# Patient Record
Sex: Male | Born: 1950 | Hispanic: Refuse to answer | Smoking: Never smoker
Health system: Southern US, Community
[De-identification: ages and names within clinical notes are randomized; demographics above are authoritative.]

## PROBLEM LIST (undated history)

## (undated) DIAGNOSIS — C61 Malignant neoplasm of prostate: Secondary | ICD-10-CM

## (undated) DIAGNOSIS — I10 Essential (primary) hypertension: Secondary | ICD-10-CM

## (undated) HISTORY — PX: NO PAST SURGERIES: SHX2092

## (undated) HISTORY — PX: PROSTATE BIOPSY: SHX241

---

## 2008-11-02 ENCOUNTER — Ambulatory Visit (HOSPITAL_COMMUNITY): Admission: RE | Admit: 2008-11-02 | Discharge: 2008-11-02 | Payer: Self-pay | Admitting: General Surgery

## 2010-10-15 NOTE — H&P (Signed)
[name], Ponce           ACCOUNT NO.:  [id number]   MEDICAL RECORD NO.:  [id number]         PATIENT TYPE:  AMB   LOCATION:  DAY                           FACILITY:  [facility]   PHYSICIAN:  Dalia Heading, M.D.  DATE OF BIRTH:  May 21, 1951   DATE OF ADMISSION:  DATE OF DISCHARGE:  LH                              HISTORY & PHYSICAL   CHIEF COMPLAINT:  Need for screening colonoscopy.   HISTORY OF PRESENT ILLNESS:  The patient is a 60 year old black male,  who is referred for endoscopic evaluation.  He needs colonoscopy for  screening purposes.  No abdominal pain, weight loss, nausea, vomiting,  diarrhea, constipation, melena, hematochezia have been noted.  He has  never had a colonoscopy.  There is no family history of colon carcinoma.   PAST MEDICAL HISTORY:  Hypertension.   PAST SURGICAL HISTORY:  Unremarkable.   CURRENT MEDICATIONS:  Atenolol 100 mg p.o. daily   ALLERGIES:  No known drug allergies.   REVIEW OF SYSTEMS:  Noncontributory.   PHYSICAL EXAMINATION:  GENERAL:  The patient is a well-developed, well-  nourished black male, in no acute distress.  LUNGS:  Clear to auscultation with equal breath sounds bilaterally.  HEART:  Regular rate and rhythm without S3, S4, murmurs.  ABDOMEN:  Soft, nontender, nondistended.  No hepatosplenomegaly or  masses noted.  RECTAL:  Deferred due to procedure.   IMPRESSION:  Need for screening colonoscopy.   PLAN:  The patient is scheduled for a colonoscopy on November 02, 2008.  The  risks and benefits of the procedure including bleeding and perforation  were fully explained to the patient, gave informed consent.      Dalia Heading, M.D.  Electronically Signed     MAJ/MEDQ  D:  10/24/2008  T:  10/25/2008  Job:  [id number]   cc:   Juan Ponce, M.D.  Fax: [phone]

## 2010-10-15 NOTE — H&P (Signed)
NAMESYRE, KNERR           ACCOUNT NO.:  [id number]   MEDICAL RECORD NO.:  [id number]         PATIENT TYPE:  AMB   LOCATION:  DAY                           FACILITY:  [facility]   PHYSICIAN:  Dalia Heading, M.D.  DATE OF BIRTH:  May 24, 1951   DATE OF ADMISSION:  DATE OF DISCHARGE:  LH                              HISTORY & PHYSICAL   CHIEF COMPLAINT:  Need for screening colonoscopy.   HISTORY OF PRESENT ILLNESS:  The patient is a 60 year old black male,  who is referred for endoscopic evaluation.  He needs colonoscopy for  screening purposes.  No abdominal pain, weight loss, nausea, vomiting,  diarrhea, constipation, melena, hematochezia have been noted.  He has  never had a colonoscopy.  There is no family history of colon carcinoma.   PAST MEDICAL HISTORY:  Hypertension.   PAST SURGICAL HISTORY:  Unremarkable.   CURRENT MEDICATIONS:  Atenolol 100 mg p.o. daily   ALLERGIES:  No known drug allergies.   REVIEW OF SYSTEMS:  Noncontributory.   PHYSICAL EXAMINATION:  GENERAL:  The patient is a well-developed, well-  nourished black male, in no acute distress.  LUNGS:  Clear to auscultation with equal breath sounds bilaterally.  HEART:  Regular rate and rhythm without S3, S4, murmurs.  ABDOMEN:  Soft, nontender, nondistended.  No hepatosplenomegaly or  masses noted.  RECTAL:  Deferred due to procedure.   IMPRESSION:  Need for screening colonoscopy.   PLAN:  The patient is scheduled for a colonoscopy on November 02, 2008.  The  risks and benefits of the procedure including bleeding and perforation  were fully explained to the patient, gave informed consent.      Dalia Heading, M.D.     MAJ/MEDQ  D:  10/24/2008  T:  10/25/2008  Job:  [id number]   cc:   Kirk Ruths, M.D.  Fax: [phone]

## 2015-07-10 MED FILL — OLMSRTN-AMLDPN-HCTZ 40-10-2: 40-10-25 | 90 days supply | Qty: 90 | Fill #1

## 2015-08-27 DIAGNOSIS — Z1389 Encounter for screening for other disorder: Secondary | ICD-10-CM | POA: Diagnosis not present

## 2015-08-27 DIAGNOSIS — E6609 Other obesity due to excess calories: Secondary | ICD-10-CM | POA: Diagnosis not present

## 2015-08-27 DIAGNOSIS — B49 Unspecified mycosis: Secondary | ICD-10-CM | POA: Diagnosis not present

## 2015-08-27 DIAGNOSIS — Z6830 Body mass index (BMI) 30.0-30.9, adult: Secondary | ICD-10-CM | POA: Diagnosis not present

## 2015-08-27 MED FILL — FLUCONAZOLE 150 MG TABLET: 150 | 3 days supply | Qty: 3 | Fill #0

## 2015-08-27 MED FILL — CLOTRIMAZOLE-BETAMETHASONE: 1-0.05 | 15 days supply | Qty: 30 | Fill #0

## 2015-10-15 MED FILL — OLMSRTN-AMLDPN-HCTZ 40-10-2: 40-10-25 | 90 days supply | Qty: 90 | Fill #2

## 2016-01-07 MED FILL — OLMSRTN-AMLDPN-HCTZ 40-10-2: 40-10-25 | 90 days supply | Qty: 90 | Fill #3

## 2016-04-02 MED FILL — HYDROCODON-APAP 7.5-325: 7.5-325 | 4 days supply | Qty: 12 | Fill #0

## 2016-04-02 MED FILL — IBUPROFEN 800 MG TABLET: 800 | 4 days supply | Qty: 12 | Fill #0

## 2016-04-10 MED FILL — HYDROCODON-APAP 7.5-325: 7.5-325 | 3 days supply | Qty: 12 | Fill #0

## 2016-04-10 MED FILL — IBUPROFEN 800 MG TABLET: 800 | 3 days supply | Qty: 12 | Fill #0

## 2016-04-16 DIAGNOSIS — R7309 Other abnormal glucose: Secondary | ICD-10-CM | POA: Diagnosis not present

## 2016-04-16 DIAGNOSIS — E6609 Other obesity due to excess calories: Secondary | ICD-10-CM | POA: Diagnosis not present

## 2016-04-16 DIAGNOSIS — I10 Essential (primary) hypertension: Secondary | ICD-10-CM | POA: Diagnosis not present

## 2016-04-16 DIAGNOSIS — Z6830 Body mass index (BMI) 30.0-30.9, adult: Secondary | ICD-10-CM | POA: Diagnosis not present

## 2016-04-16 DIAGNOSIS — Z0000 Encounter for general adult medical examination without abnormal findings: Secondary | ICD-10-CM | POA: Diagnosis not present

## 2016-04-16 DIAGNOSIS — E782 Mixed hyperlipidemia: Secondary | ICD-10-CM | POA: Diagnosis not present

## 2016-04-16 DIAGNOSIS — Z1389 Encounter for screening for other disorder: Secondary | ICD-10-CM | POA: Diagnosis not present

## 2016-05-02 MED FILL — OLMSRTN-AMLDPN-HCTZ 40-10-1: 40-10-12.5 | 90 days supply | Qty: 90 | Fill #0

## 2016-07-24 MED FILL — OLMSRTN-AMLDPN-HCTZ 40-10-1: 40-10-12.5 | 90 days supply | Qty: 90 | Fill #1

## 2016-10-22 MED FILL — OLMSRTN-AMLDPN-HCTZ 40-10-1: 40-10-12.5 | 90 days supply | Qty: 90 | Fill #2

## 2017-01-16 MED FILL — OLMSRTN-AMLDPN-HCTZ 40-10-1: 40-10-12.5 | 30 days supply | Qty: 30 | Fill #0

## 2017-03-02 DIAGNOSIS — Z6831 Body mass index (BMI) 31.0-31.9, adult: Secondary | ICD-10-CM | POA: Diagnosis not present

## 2017-03-02 DIAGNOSIS — I10 Essential (primary) hypertension: Secondary | ICD-10-CM | POA: Diagnosis not present

## 2017-03-02 DIAGNOSIS — Z0000 Encounter for general adult medical examination without abnormal findings: Secondary | ICD-10-CM | POA: Diagnosis not present

## 2017-03-02 DIAGNOSIS — E6609 Other obesity due to excess calories: Secondary | ICD-10-CM | POA: Diagnosis not present

## 2017-03-02 MED FILL — OLMSRTN-AMLDPN-HCTZ 40-10-1: 40-10-12.5 | 90 days supply | Qty: 90 | Fill #0

## 2017-03-04 DIAGNOSIS — Z0000 Encounter for general adult medical examination without abnormal findings: Secondary | ICD-10-CM | POA: Diagnosis not present

## 2017-03-04 DIAGNOSIS — R739 Hyperglycemia, unspecified: Secondary | ICD-10-CM | POA: Diagnosis not present

## 2017-03-17 DIAGNOSIS — K7689 Other specified diseases of liver: Secondary | ICD-10-CM | POA: Diagnosis not present

## 2017-03-30 DIAGNOSIS — K760 Fatty (change of) liver, not elsewhere classified: Secondary | ICD-10-CM | POA: Diagnosis not present

## 2017-03-30 DIAGNOSIS — R748 Abnormal levels of other serum enzymes: Secondary | ICD-10-CM | POA: Diagnosis not present

## 2017-05-28 MED FILL — OLMSRTN-AMLDPN-HCTZ 40-10-1: 40-10-12.5 | 90 days supply | Qty: 90 | Fill #1

## 2017-08-19 MED FILL — OLMSRTN-AMLDPN-HCTZ 40-10-1: 40-10-12.5 | 90 days supply | Qty: 90 | Fill #0

## 2017-11-03 MED FILL — AMOXICILLIN 500 MG CAPSULE: 500 | 10 days supply | Qty: 30 | Fill #0

## 2017-11-04 DIAGNOSIS — I10 Essential (primary) hypertension: Secondary | ICD-10-CM | POA: Diagnosis not present

## 2017-11-04 DIAGNOSIS — R945 Abnormal results of liver function studies: Secondary | ICD-10-CM | POA: Diagnosis not present

## 2017-11-04 DIAGNOSIS — R972 Elevated prostate specific antigen [PSA]: Secondary | ICD-10-CM | POA: Diagnosis not present

## 2017-11-04 DIAGNOSIS — E782 Mixed hyperlipidemia: Secondary | ICD-10-CM | POA: Diagnosis not present

## 2017-11-04 DIAGNOSIS — Z1389 Encounter for screening for other disorder: Secondary | ICD-10-CM | POA: Diagnosis not present

## 2017-11-04 DIAGNOSIS — Z6829 Body mass index (BMI) 29.0-29.9, adult: Secondary | ICD-10-CM | POA: Diagnosis not present

## 2017-11-04 DIAGNOSIS — R7309 Other abnormal glucose: Secondary | ICD-10-CM | POA: Diagnosis not present

## 2017-11-04 DIAGNOSIS — E663 Overweight: Secondary | ICD-10-CM | POA: Diagnosis not present

## 2017-11-04 MED FILL — TELMISARTAN-AMLODIPINE 40-1: 40-10 | 90 days supply | Qty: 90 | Fill #0

## 2018-01-26 MED FILL — TELMISARTAN-AMLODIPINE 40-1: 40-10 | 90 days supply | Qty: 90 | Fill #1

## 2018-03-25 DIAGNOSIS — E782 Mixed hyperlipidemia: Secondary | ICD-10-CM | POA: Diagnosis not present

## 2018-03-25 DIAGNOSIS — Z23 Encounter for immunization: Secondary | ICD-10-CM | POA: Diagnosis not present

## 2018-03-25 DIAGNOSIS — Z1389 Encounter for screening for other disorder: Secondary | ICD-10-CM | POA: Diagnosis not present

## 2018-03-25 DIAGNOSIS — R945 Abnormal results of liver function studies: Secondary | ICD-10-CM | POA: Diagnosis not present

## 2018-03-25 DIAGNOSIS — Z6832 Body mass index (BMI) 32.0-32.9, adult: Secondary | ICD-10-CM | POA: Diagnosis not present

## 2018-03-25 DIAGNOSIS — R030 Elevated blood-pressure reading, without diagnosis of hypertension: Secondary | ICD-10-CM | POA: Diagnosis not present

## 2018-03-25 DIAGNOSIS — I10 Essential (primary) hypertension: Secondary | ICD-10-CM | POA: Diagnosis not present

## 2018-03-25 DIAGNOSIS — Z0001 Encounter for general adult medical examination with abnormal findings: Secondary | ICD-10-CM | POA: Diagnosis not present

## 2018-04-23 MED FILL — TELMISARTAN-AMLODIPINE 40-1: 40-10 | 90 days supply | Qty: 90 | Fill #2

## 2018-05-05 ENCOUNTER — Ambulatory Visit: Payer: 59 | Admitting: Urology

## 2018-05-05 DIAGNOSIS — R972 Elevated prostate specific antigen [PSA]: Secondary | ICD-10-CM | POA: Diagnosis not present

## 2018-05-05 MED FILL — levoFLOXacin 750 MG TABS: 750 | 1 days supply | Qty: 1 | Fill #0

## 2018-05-06 ENCOUNTER — Other Ambulatory Visit: Payer: Self-pay | Admitting: Urology

## 2018-05-06 DIAGNOSIS — R972 Elevated prostate specific antigen [PSA]: Secondary | ICD-10-CM

## 2018-05-19 ENCOUNTER — Ambulatory Visit (HOSPITAL_COMMUNITY)
Admission: RE | Admit: 2018-05-19 | Discharge: 2018-05-19 | Disposition: A | Discharge status: Home / Self Care | Payer: 59 | Source: Ambulatory Visit | Attending: Urology | Admitting: Urology

## 2018-05-19 ENCOUNTER — Encounter (HOSPITAL_COMMUNITY): Payer: Self-pay

## 2018-05-19 DIAGNOSIS — R972 Elevated prostate specific antigen [PSA]: Secondary | ICD-10-CM | POA: Diagnosis not present

## 2018-05-19 DIAGNOSIS — C61 Malignant neoplasm of prostate: Secondary | ICD-10-CM | POA: Insufficient documentation

## 2018-05-19 HISTORY — DX: Essential (primary) hypertension: I10

## 2018-05-19 MED ORDER — GENTAMICIN SULFATE 40 MG/ML IJ SOLN
80.0000 mg | Freq: Once | INTRAMUSCULAR | Status: AC
  Administered 2018-05-19: 80 mg via INTRAMUSCULAR

## 2018-05-19 MED ORDER — LIDOCAINE HCL (PF) 2 % IJ SOLN
10.0000 mL | Freq: Once | INTRAMUSCULAR | Status: AC
  Administered 2018-05-19: 10 mL

## 2018-05-19 MED ORDER — GENTAMICIN SULFATE 40 MG/ML IJ SOLN
INTRAMUSCULAR | Status: AC
  Administered 2018-05-19: 80 mg via INTRAMUSCULAR
  Filled 2018-05-19: qty 2

## 2018-05-19 MED ORDER — LIDOCAINE HCL (PF) 2 % IJ SOLN
INTRAMUSCULAR | Status: AC
  Administered 2018-05-19: 10 mL
  Filled 2018-05-19: qty 10

## 2018-06-23 ENCOUNTER — Institutional Professional Consult (permissible substitution): Payer: 59 | Admitting: Urology

## 2018-06-23 DIAGNOSIS — C61 Malignant neoplasm of prostate: Secondary | ICD-10-CM | POA: Diagnosis not present

## 2018-07-01 NOTE — Progress Notes (Signed)
GU Location of Tumor / Histology: Prostatic Adenocarcinoma  If Prostate Cancer, Gleason Score is (4+ 3) and PSA is (5.2). Prostate volume: 63.05 grams.  Milana Kidney was referred by Collene Mares, PA-C to Dr. Nicolette Bang for further evaluation of an elevated PSA.  Biopsies of prostate from 05/19/2018   Past/Anticipated interventions by urology, if any: prostate biopsy (05/19/2018),  Referral for consideration of IMRT vs brachytherapy  Past/Anticipated interventions by medical oncology, if any: no  Weight changes, if any: no  Bowel/Bladder complaints, if any: Reports nocturia X 2. Reports mild ED and LUTS. SHIM 19. IPSS 8. Denies dysuria, hematuria, urinary leakage or incontinence.   Nausea/Vomiting, if any: no  Pain issues, if any:  Denies any new pain.  SAFETY ISSUES:  Prior radiation? no  Pacemaker/ICD? no  Possible current pregnancy? no, male patient  Is the patient on methotrexate? no  Current Complaints / other details: 68 year old male. NKDA. Brother was dx with prostate ca in his early 53s and received xrt. Reports another brother had lung ca (smoker). Reports a sister with breast ca. Works at [employer] in the [place] and has for 34 years.

## 2018-07-02 DIAGNOSIS — C61 Malignant neoplasm of prostate: Secondary | ICD-10-CM | POA: Insufficient documentation

## 2018-07-05 ENCOUNTER — Encounter: Payer: Self-pay | Admitting: Radiation Oncology

## 2018-07-06 ENCOUNTER — Other Ambulatory Visit: Payer: Self-pay

## 2018-07-06 ENCOUNTER — Ambulatory Visit
Admission: RE | Admit: 2018-07-06 | Discharge: 2018-07-06 | Disposition: A | Discharge status: Home / Self Care | Payer: 59 | Source: Ambulatory Visit | Attending: Radiation Oncology | Admitting: Radiation Oncology

## 2018-07-06 ENCOUNTER — Encounter: Payer: Self-pay | Admitting: Radiation Oncology

## 2018-07-06 ENCOUNTER — Encounter: Payer: Self-pay | Admitting: Medical Oncology

## 2018-07-06 ENCOUNTER — Telehealth: Payer: Self-pay | Admitting: Radiation Oncology

## 2018-07-06 DIAGNOSIS — C61 Malignant neoplasm of prostate: Secondary | ICD-10-CM | POA: Insufficient documentation

## 2018-07-06 DIAGNOSIS — I10 Essential (primary) hypertension: Secondary | ICD-10-CM | POA: Insufficient documentation

## 2018-07-06 DIAGNOSIS — Z803 Family history of malignant neoplasm of breast: Secondary | ICD-10-CM | POA: Insufficient documentation

## 2018-07-06 DIAGNOSIS — Z801 Family history of malignant neoplasm of trachea, bronchus and lung: Secondary | ICD-10-CM | POA: Diagnosis not present

## 2018-07-06 DIAGNOSIS — Z8042 Family history of malignant neoplasm of prostate: Secondary | ICD-10-CM | POA: Diagnosis not present

## 2018-07-06 DIAGNOSIS — R972 Elevated prostate specific antigen [PSA]: Secondary | ICD-10-CM | POA: Diagnosis not present

## 2018-07-06 HISTORY — DX: Malignant neoplasm of prostate: C61

## 2018-07-06 NOTE — Telephone Encounter (Signed)
Phoned patient out of concern. Patient reports he is running late.

## 2018-07-06 NOTE — Progress Notes (Signed)
Introduced myself to Mr. Velis as the prostate nurse navigator and my role. He is not interested in surgery but needs more information about his radiation options. He asked how long he can postpone treatment. I explained he will need to discuss with Dr. Tammi Klippel. He works in the [place] at [employer] and is thinking about retirement and would begin treatment after his retirement. I explained the side effects of treatment should not hinder him form working and we will be happy to work with him and his work schedule. I gave him my business card and asked him to call me with questions or concerns. He voiced understanding.

## 2018-07-06 NOTE — Progress Notes (Signed)
Radiation Oncology         [phone] ________________________________  Initial Outpatient Consultation  Name: Juan Ponce MRN: [id number]  Date: 07/06/2018  DOB: Mar 07, 1951  LY:[id number], Jenny Reichmann, MD  McKenzie, Candee Furbish, MD   REFERRING PHYSICIAN: Cleon Gustin, MD  DIAGNOSIS: 68 y.o. gentleman with Stage T1c adenocarcinoma of the prostate with Gleason score of 4+3, and PSA of 5.2.    ICD-10-CM   1. Malignant neoplasm of prostate ([place]) C61     HISTORY OF PRESENT ILLNESS: Juan Ponce is a 68 y.o. male with a diagnosis of prostate cancer. He was noted to have an elevated PSA of 5.2 in June 2019 by his primary care physician, Dr. Sharilyn Sites.  Accordingly, he was referred for evaluation in urology by Dr. Alyson Ingles on 05/05/2018, and a digital rectal examination was performed at that time revealing no prostate nodules.  The patient proceeded to transrectal ultrasound with 12 biopsies of the prostate on 05/19/2018.  The prostate volume measured 63.05 cc.  Out of 12 core biopsies, 1 was positive.  The maximum Gleason score was 4+3, and this was seen in the left base lateral.  The patient reviewed the biopsy results with his urologist and he has kindly been referred today for discussion of potential radiation treatment options.   PREVIOUS RADIATION THERAPY: No  PAST MEDICAL HISTORY:  Past Medical History:  Diagnosis Date  . Hypertension   . Prostate cancer ([place])       PAST SURGICAL HISTORY: Past Surgical History:  Procedure Laterality Date  . NO PAST SURGERIES    . PROSTATE BIOPSY      FAMILY HISTORY:  Family History  Problem Relation Age of Onset  . Prostate cancer Brother   . Breast cancer Sister   . Lung cancer Brother   . Colon cancer Neg Hx   . Pancreatic cancer Neg Hx     SOCIAL HISTORY:  Social History   Socioeconomic History  . Marital status: Legally Separated    Spouse name: Not on file  . Number of children: Not on file  . Years of  education: Not on file  . Highest education level: Not on file  Occupational History  . Not on file  Social Needs  . Financial resource strain: Not on file  . Food insecurity:    Worry: Not on file    Inability: Not on file  . Transportation needs:    Medical: Not on file    Non-medical: Not on file  Tobacco Use  . Smoking status: Never Smoker  . Smokeless tobacco: Never Used  Substance and Sexual Activity  . Alcohol use: Yes    Comment: 2 drinks per day  . Drug use: Never  . Sexual activity: Yes    Comment: mild ED  Lifestyle  . Physical activity:    Days per week: Not on file    Minutes per session: Not on file  . Stress: Not on file  Relationships  . Social connections:    Talks on phone: Not on file    Gets together: Not on file    Attends religious service: Not on file    Active member of club or organization: Not on file    Attends meetings of clubs or organizations: Not on file    Relationship status: Not on file  . Intimate partner violence:    Fear of current or ex partner: Not on file    Emotionally abused: Not on file  Physically abused: Not on file    Forced sexual activity: Not on file  Other Topics Concern  . Not on file  Social History Narrative  . Not on file    ALLERGIES: Patient has no known allergies.  MEDICATIONS:  Current Outpatient Medications  Medication Sig Dispense Refill  . Telmisartan-amLODIPine 40-10 MG TABS   2   No current facility-administered medications for this encounter.     REVIEW OF SYSTEMS:  On review of systems, the patient reports that he is doing well overall. He denies any chest pain, shortness of breath, cough, fevers, chills, night sweats, or unintended weight changes. He denies any bowel disturbances, and denies abdominal pain, nausea or vomiting. He denies any new musculoskeletal or joint aches or pains. His IPSS was 8, indicating moderate urinary symptoms with nocturia x2. He denies dysuria, hematuria, leakage or  incontinence. His SHIM was 19, indicating he does have mild erectile dysfunction. A complete review of systems is obtained and is otherwise negative.    PHYSICAL EXAM:  Wt Readings from Last 3 Encounters:  07/06/18 191 lb 6.4 oz (86.8 kg)   Temp Readings from Last 3 Encounters:  07/06/18 98.2 F (36.8 C) (Oral)  05/19/18 98.3 F (36.8 C) (Oral)   BP Readings from Last 3 Encounters:  07/06/18 (!) 149/94  05/19/18 (!) 164/98   Pulse Readings from Last 3 Encounters:  07/06/18 (!) 106  05/19/18 100   Pain Assessment Pain Score: 0-No pain/10  In general this is a well appearing African-American male in no acute distress. He is alert and oriented x4 and appropriate throughout the examination. HEENT reveals that the patient is normocephalic, atraumatic. EOMs are intact. PERRLA. Skin is intact without any evidence of gross lesions. Cardiovascular exam reveals a regular rate and rhythm, no clicks rubs or murmurs are auscultated. Chest is clear to auscultation bilaterally. Lymphatic assessment is performed and does not reveal any adenopathy in the cervical, supraclavicular, axillary, or inguinal chains. Abdomen has active bowel sounds in all quadrants and is intact. The abdomen is soft, non tender, non distended. Lower extremities are negative for pretibial pitting edema, deep calf tenderness, cyanosis or clubbing.   KPS = 100  100 - Normal; no complaints; no evidence of disease. 90   - Able to carry on normal activity; minor signs or symptoms of disease. 80   - Normal activity with effort; some signs or symptoms of disease. 49   - Cares for self; unable to carry on normal activity or to do active work. 60   - Requires occasional assistance, but is able to care for most of his personal needs. 50   - Requires considerable assistance and frequent medical care. 60   - Disabled; requires special care and assistance. 38   - Severely disabled; hospital admission is indicated although death not  imminent. 71   - Very sick; hospital admission necessary; active supportive treatment necessary. 10   - Moribund; fatal processes progressing rapidly. 0     - Dead  Karnofsky DA, Abelmann WH, Craver LS and Burchenal JH [phone]) The use of the nitrogen mustards in the palliative treatment of carcinoma: with particular reference to bronchogenic carcinoma Cancer 1 634-56  LABORATORY DATA:  No results found for: WBC, HGB, HCT, MCV, PLT No results found for: NA, K, CL, CO2 No results found for: ALT, AST, GGT, ALKPHOS, BILITOT   RADIOGRAPHY: No results found.    IMPRESSION/PLAN: 1. 68 y.o. gentleman with Stage T1c adenocarcinoma of the prostate with  Gleason Score of 4+3, and PSA of 5.2. We discussed the patient's workup and outlined the nature of prostate cancer in this setting. The patient's T stage, Gleason's score, and PSA put him into the unfavorable intermediate risk group. Accordingly, he is eligible for a variety of potential treatment options including brachytherapy or 5.5 weeks of external radiation.  We discussed that should he elect to proceed with prostate brachytherapy, given his large prostate volume of 63 g, we would recommend he start a 5 alpha reductase inhibitor to downsize the prostate prior to the procedure.  We discussed the available radiation techniques, and focused on the details and logistics and delivery. We discussed and outlined the risks, benefits, short and long-term effects associated with radiotherapy and compared and contrasted these with prostatectomy. We discussed the role of SpaceOAR in reducing the rectal toxicity associated with radiotherapy.   At the end of the conversation the patient remains undecided regarding his final treatment preference.  He appears to be leaning towards a 5-1/2-week course of daily prostate IMRT but is still considering brachii therapy as well. We will contact him in the near future to touch base on which treatment option he prefers. He knows  to call with any additional questions or concerns.  We spent 60 minutes face to face with the patient and more than 50% of that time was spent in counseling and/or coordination of care.    Nicholos Johns, PA-C    Tyler Pita, MD  [facility] Direct Dial: [phone]  Fax: [phone] [email or web address]  Skype  [email or web address]  This document serves as a record of services personally performed by Tyler Pita, MD and Freeman Caldron, PA-C. It was created on their behalf by Rae Lips, a trained medical scribe. The creation of this record is based on the scribe's personal observations and the providers' statements to them. This document has been checked and approved by the attending providers.

## 2018-07-06 NOTE — Progress Notes (Signed)
See progress note under physician encounter. 

## 2018-07-15 ENCOUNTER — Telehealth: Payer: Self-pay | Admitting: Radiation Oncology

## 2018-07-15 NOTE — Telephone Encounter (Signed)
Received message from Romie Jumper that the patient has questions. Phoned patient to inquire. Patient questions if he gets the seed how long he will have them in. Explained the radioactive prostate seeds would remain in his prostate forever but only be therapeutic for up to one year. Patient questions his success rate with treatment. Explained that based on his gleason score, psa, and prostate volume he has a 90% or better chance of cure with either brachytherapy or IMRT. Patient questioned his stage of cancer. Explained he is a T1c. Patient questions if he will have to take medication with the radiation seeds. Explained that because his prostate size is large he will need to take medication in the form of a shot or pill given to him by Dr. Alyson Ingles to reduce the size of his prostate before the seeds can be placed. Patient reports he is scheduled to follow up with Dr. Alyson Ingles on 08/04/2018. Patient verbalized understanding of all reviewed.

## 2018-07-19 ENCOUNTER — Encounter: Payer: Self-pay | Admitting: Medical Oncology

## 2018-08-02 MED FILL — TELMISARTAN-AMLODIPINE 40-1: 40-10 | 90 days supply | Qty: 90 | Fill #0

## 2018-08-04 ENCOUNTER — Ambulatory Visit: Payer: 59 | Admitting: Urology

## 2018-08-04 DIAGNOSIS — C61 Malignant neoplasm of prostate: Secondary | ICD-10-CM

## 2018-08-23 ENCOUNTER — Telehealth: Payer: Self-pay | Admitting: Medical Oncology

## 2018-09-07 ENCOUNTER — Telehealth: Payer: Self-pay | Admitting: Medical Oncology

## 2018-09-07 NOTE — Telephone Encounter (Signed)
Juan Ponce returned call stating that he did not receive any prescriptions at his visit 3/4 with Dr. Alyson Ingles. Ashlyn-PA notified.

## 2018-09-07 NOTE — Telephone Encounter (Signed)
Left message with patient to inform him Dr. Noland Fordyce office should be calling him with an appointment to discuss ADT. I explained this should help to downsize his prostate and cover him while we are waiting to get brachytherapy scheduled. I asked him to call with questions or concerns.

## 2018-09-07 NOTE — Telephone Encounter (Signed)
Spoke with  Dr. Noland Fordyce nurse to discuss patient receiving ADT per Ashlyn to downsize his prostate and cover him while waiting to get brachytherapy scheduled. If this is not an option, patient could move forward with IMRT. She informed me Dr. Alyson Ingles is out of the office until Thursday but she will discuss with his upon his return. I asked her to call me with his decision.

## 2018-09-07 NOTE — Progress Notes (Signed)
Left message with patient encouraging him to further discuss treatment options with Dr. Alyson Ingles at his follow up March 3. I asked him to call me and I can help answer questions and or concerns.

## 2018-09-07 NOTE — Telephone Encounter (Signed)
Left message with patient asking if Dr. Alyson Ingles gave him any medications to help reduce the size of his prostate on 3/4 office visit. I asked him to return my call.

## 2018-09-09 ENCOUNTER — Telehealth: Payer: Self-pay | Admitting: Medical Oncology

## 2018-09-09 NOTE — Telephone Encounter (Signed)
Juan Ponce called back asking why he needs ADT. I explained that it will help to reduce the size of his prostate in preparation for seed implant and keep the cancer from growing while waiting to be scheduled for brachytherapy. We discussed the side effects of ADT and he voiced understanding. He should be receiving a call from Dr. Noland Fordyce office to schedule.

## 2018-09-17 ENCOUNTER — Telehealth: Payer: Self-pay | Admitting: Medical Oncology

## 2018-09-17 NOTE — Telephone Encounter (Signed)
Spoke with Juan Ponce at [facility] to inform her that Mr. Piper needs ST-ADT to decrease prostate size and to cover him due to delay in seed implant surgeries being delayed due to COVID-19. I spoke with Dr. Noland Fordyce nurse last week but not sure if he has been able to address. She states she will send a high priority message to him.

## 2018-09-21 ENCOUNTER — Telehealth: Payer: Self-pay | Admitting: Medical Oncology

## 2018-09-21 NOTE — Telephone Encounter (Signed)
Spoke with [facility] regarding ST-ADT for patient to help downsize prostate and to cover him while waiting to schedule seed implant. I informed her that I have called and left message for Dr. Alyson Ingles but it has not been scheduled. She states that due to the COVID-19 that the days in the office have been limited but she will try to get his scheduled tomorrow.

## 2018-09-23 ENCOUNTER — Telehealth: Payer: Self-pay | Admitting: Medical Oncology

## 2018-09-23 NOTE — Telephone Encounter (Signed)
Spoke with Juan Ponce to see if he spoke with Dr. Noland Fordyce office to schedule appointment for ADT. He states he received a voicemail yesterday and tired to return the call but was unable to get reach them. He states he is off tomorrow and asked if someone could call him. I will relay the message to Dr. Noland Fordyce nurse in the am. They are not in the [facility] office today.

## 2018-09-27 ENCOUNTER — Other Ambulatory Visit: Payer: Self-pay | Admitting: Urology

## 2018-09-28 ENCOUNTER — Telehealth: Payer: Self-pay | Admitting: *Deleted

## 2018-09-28 ENCOUNTER — Telehealth: Payer: Self-pay | Admitting: Urology

## 2018-09-28 NOTE — Telephone Encounter (Signed)
Has an appointment 5/6 with Dr. Alyson Ingles to discuss ADT to downsize prostate and protect from disease progression while awaiting brachytherapy implant.

## 2018-09-28 NOTE — Telephone Encounter (Signed)
CALLED PATIENT TO INFORM OF PRE-SEED PLANNING CT AND FU FOR 11-25-18 @ [facility] AND HIS IMPLANT, LVM FOR A RETURN CALL

## 2018-10-06 ENCOUNTER — Ambulatory Visit (INDEPENDENT_AMBULATORY_CARE_PROVIDER_SITE_OTHER): Payer: 59 | Admitting: Urology

## 2018-10-06 DIAGNOSIS — C61 Malignant neoplasm of prostate: Secondary | ICD-10-CM | POA: Diagnosis not present

## 2018-10-12 ENCOUNTER — Encounter: Payer: Self-pay | Admitting: Urology

## 2018-10-12 ENCOUNTER — Encounter: Payer: Self-pay | Admitting: Medical Oncology

## 2018-10-12 ENCOUNTER — Other Ambulatory Visit: Payer: Self-pay | Admitting: Urology

## 2018-10-12 ENCOUNTER — Ambulatory Visit (INDEPENDENT_AMBULATORY_CARE_PROVIDER_SITE_OTHER): Payer: 59 | Admitting: Urology

## 2018-10-12 DIAGNOSIS — C61 Malignant neoplasm of prostate: Secondary | ICD-10-CM

## 2018-10-12 NOTE — Progress Notes (Signed)
Patient started ADT with [facility] on 10/06/2018 and is scheduled for brachiytherapy on 12/23/2018.  Nicholos Johns, MMS, PA-C [facility]: [phone]  Fax: [phone]

## 2018-10-13 ENCOUNTER — Other Ambulatory Visit: Payer: Self-pay | Admitting: Urology

## 2018-11-03 DIAGNOSIS — E6609 Other obesity due to excess calories: Secondary | ICD-10-CM | POA: Diagnosis not present

## 2018-11-03 DIAGNOSIS — C61 Malignant neoplasm of prostate: Secondary | ICD-10-CM | POA: Diagnosis not present

## 2018-11-03 DIAGNOSIS — Z1389 Encounter for screening for other disorder: Secondary | ICD-10-CM | POA: Diagnosis not present

## 2018-11-03 DIAGNOSIS — Z6832 Body mass index (BMI) 32.0-32.9, adult: Secondary | ICD-10-CM | POA: Diagnosis not present

## 2018-11-03 DIAGNOSIS — I10 Essential (primary) hypertension: Secondary | ICD-10-CM | POA: Diagnosis not present

## 2018-11-03 MED FILL — OLMESARTAN MEDOXOMIL 40 MG: 40 | 30 days supply | Qty: 30 | Fill #0

## 2018-11-03 MED FILL — DILT-XR 240 MG CAP SA: 240 | 90 days supply | Qty: 90 | Fill #0

## 2018-11-10 ENCOUNTER — Ambulatory Visit (INDEPENDENT_AMBULATORY_CARE_PROVIDER_SITE_OTHER): Payer: 59 | Admitting: Urology

## 2018-11-10 DIAGNOSIS — C61 Malignant neoplasm of prostate: Secondary | ICD-10-CM

## 2018-11-23 ENCOUNTER — Telehealth: Payer: Self-pay | Admitting: *Deleted

## 2018-11-23 NOTE — Telephone Encounter (Signed)
CALLED PATIENT TO INFORM OF CHANGE TO SCHEDULE ON 11-25-18, LVM FOR A RETURN CALL

## 2018-11-25 ENCOUNTER — Other Ambulatory Visit: Payer: Self-pay

## 2018-11-25 ENCOUNTER — Ambulatory Visit
Admission: RE | Admit: 2018-11-25 | Discharge: 2018-11-25 | Disposition: A | Discharge status: Home / Self Care | Payer: 59 | Source: Ambulatory Visit | Attending: Urology | Admitting: Urology

## 2018-11-25 ENCOUNTER — Encounter (HOSPITAL_COMMUNITY): Payer: 59

## 2018-11-25 ENCOUNTER — Encounter (HOSPITAL_COMMUNITY)
Admission: RE | Admit: 2018-11-25 | Discharge: 2018-11-25 | Disposition: A | Discharge status: Home / Self Care | Payer: 59 | Source: Ambulatory Visit | Attending: Urology | Admitting: Urology

## 2018-11-25 ENCOUNTER — Ambulatory Visit
Admission: RE | Admit: 2018-11-25 | Discharge: 2018-11-25 | Disposition: A | Discharge status: Home / Self Care | Payer: 59 | Source: Ambulatory Visit | Attending: Radiation Oncology | Admitting: Radiation Oncology

## 2018-11-25 ENCOUNTER — Ambulatory Visit (HOSPITAL_COMMUNITY)
Admission: RE | Admit: 2018-11-25 | Discharge: 2018-11-25 | Disposition: A | Discharge status: Home / Self Care | Payer: 59 | Source: Ambulatory Visit | Attending: Anesthesiology | Admitting: Anesthesiology

## 2018-11-25 DIAGNOSIS — C61 Malignant neoplasm of prostate: Secondary | ICD-10-CM | POA: Insufficient documentation

## 2018-11-25 DIAGNOSIS — Z801 Family history of malignant neoplasm of trachea, bronchus and lung: Secondary | ICD-10-CM | POA: Diagnosis not present

## 2018-11-25 DIAGNOSIS — I10 Essential (primary) hypertension: Secondary | ICD-10-CM | POA: Insufficient documentation

## 2018-11-25 DIAGNOSIS — Z01818 Encounter for other preprocedural examination: Secondary | ICD-10-CM | POA: Diagnosis not present

## 2018-11-25 DIAGNOSIS — Z803 Family history of malignant neoplasm of breast: Secondary | ICD-10-CM | POA: Diagnosis not present

## 2018-11-25 NOTE — Progress Notes (Signed)
  Radiation Oncology         [phone] ________________________________  Name: Juan Ponce MRN: [id number]  Date: 11/25/2018  DOB: 06/09/1950  SIMULATION AND TREATMENT PLANNING NOTE PUBIC ARCH STUDY  XM:DYJWLKH, Jenny Reichmann, MD  Cleon Gustin, MD  DIAGNOSIS: 68 y.o. gentleman with Stage T1c adenocarcinoma of the prostate with Gleason score of 4+3, and PSA of 5.2.     ICD-10-CM   1. Malignant neoplasm of prostate ([place])  C61     COMPLEX SIMULATION:  The patient presented today for evaluation for possible prostate seed implant. He was brought to the radiation planning suite and placed supine on the CT couch. A 3-dimensional image study set was obtained in upload to the planning computer. There, on each axial slice, I contoured the prostate gland. Then, using three-dimensional radiation planning tools I reconstructed the prostate in view of the structures from the transperineal needle pathway to assess for possible pubic arch interference. In doing so, I did not appreciate any pubic arch interference. Also, the patient's prostate volume was estimated based on the drawn structure. The volume was 61 cc.  Given the pubic arch appearance and prostate volume, patient remains a good candidate to proceed with prostate seed implant. Today, he freely provided informed written consent to proceed.    PLAN: The patient will undergo prostate seed implant.   ________________________________  Sheral Apley. Tammi Klippel, M.D.

## 2018-12-06 MED FILL — OLMESARTAN MEDOXOMIL 40 MG: 40 | 30 days supply | Qty: 30 | Fill #1

## 2018-12-15 ENCOUNTER — Ambulatory Visit (INDEPENDENT_AMBULATORY_CARE_PROVIDER_SITE_OTHER): Payer: 59 | Admitting: Urology

## 2018-12-15 DIAGNOSIS — C61 Malignant neoplasm of prostate: Secondary | ICD-10-CM

## 2018-12-20 ENCOUNTER — Other Ambulatory Visit: Payer: Self-pay

## 2018-12-20 ENCOUNTER — Other Ambulatory Visit (HOSPITAL_COMMUNITY)
Admission: RE | Admit: 2018-12-20 | Discharge: 2018-12-20 | Disposition: A | Discharge status: Home / Self Care | Payer: 59 | Source: Ambulatory Visit | Attending: Urology | Admitting: Urology

## 2018-12-20 ENCOUNTER — Encounter (HOSPITAL_COMMUNITY)
Admission: RE | Admit: 2018-12-20 | Discharge: 2018-12-20 | Disposition: A | Discharge status: Home / Self Care | Payer: 59 | Source: Ambulatory Visit | Attending: Urology | Admitting: Urology

## 2018-12-20 DIAGNOSIS — I10 Essential (primary) hypertension: Secondary | ICD-10-CM | POA: Diagnosis not present

## 2018-12-20 DIAGNOSIS — Z1159 Encounter for screening for other viral diseases: Secondary | ICD-10-CM | POA: Diagnosis not present

## 2018-12-20 DIAGNOSIS — C61 Malignant neoplasm of prostate: Secondary | ICD-10-CM | POA: Diagnosis not present

## 2018-12-20 LAB — COMPREHENSIVE METABOLIC PANEL
ALT: 47 U/L — ABNORMAL HIGH (ref 0–44)
AST: 29 U/L (ref 15–41)
Albumin: 4.1 g/dL (ref 3.5–5.0)
Alkaline Phosphatase: 68 U/L (ref 38–126)
Anion gap: 5 (ref 5–15)
BUN: 12 mg/dL (ref 8–23)
CO2: 28 mmol/L (ref 22–32)
Calcium: 9.1 mg/dL (ref 8.9–10.3)
Chloride: 107 mmol/L (ref 98–111)
Creatinine, Ser: 0.87 mg/dL (ref 0.61–1.24)
GFR calc Af Amer: >60 mL/min (ref >60)
GFR calc non Af Amer: >60 mL/min (ref >60)
Glucose, Bld: 180 mg/dL — ABNORMAL HIGH (ref 70–99)
Potassium: 3.9 mmol/L (ref 3.5–5.1)
Sodium: 140 mmol/L (ref 135–145)
Total Bilirubin: 0.4 mg/dL (ref 0.3–1.2)
Total Protein: 7.1 g/dL (ref 6.5–8.1)

## 2018-12-20 LAB — CBC
HCT: 41.2 % (ref 39.0–52.0)
Hemoglobin: 13.7 g/dL (ref 13.0–17.0)
MCH: 32.4 pg (ref 26.0–34.0)
MCHC: 33.3 g/dL (ref 30.0–36.0)
MCV: 97.4 fL (ref 80.0–100.0)
Platelets: 246 10*3/uL (ref 150–400)
RBC: 4.23 MIL/uL (ref 4.22–5.81)
RDW: 11.7 % (ref 11.5–15.5)
WBC: 7 10*3/uL (ref 4.0–10.5)
nRBC: 0 % (ref 0.0–0.2)

## 2018-12-20 LAB — PROTIME-INR
INR: 1 (ref 0.8–1.2)
Prothrombin Time: 12.6 seconds (ref 11.4–15.2)

## 2018-12-20 LAB — APTT: aPTT: 27 seconds (ref 24–36)

## 2018-12-20 LAB — SARS CORONAVIRUS 2 (TAT 6-24 HRS): SARS Coronavirus 2: NEGATIVE

## 2018-12-21 ENCOUNTER — Encounter (HOSPITAL_BASED_OUTPATIENT_CLINIC_OR_DEPARTMENT_OTHER): Payer: Self-pay | Admitting: *Deleted

## 2018-12-21 ENCOUNTER — Other Ambulatory Visit: Payer: Self-pay

## 2018-12-21 NOTE — Progress Notes (Signed)
Spoke with patient via telephone for pre op interview. NPO after MN. No medications AM of surgery. Patient verbalized understanding of using FLEETS enema AM of surgery. Arrival time 1100.

## 2018-12-22 ENCOUNTER — Telehealth: Payer: Self-pay | Admitting: *Deleted

## 2018-12-22 NOTE — Telephone Encounter (Signed)
CALLED PATIENT TO REMND OF PROCEDURE FOR 12-23-18, SPOKE WITH PATIENT AND HE IS AWARE OF THIS PROCEDURE

## 2018-12-22 NOTE — Telephone Encounter (Signed)
XXXX 

## 2018-12-23 ENCOUNTER — Ambulatory Visit (HOSPITAL_COMMUNITY): Payer: 59

## 2018-12-23 ENCOUNTER — Encounter (HOSPITAL_BASED_OUTPATIENT_CLINIC_OR_DEPARTMENT_OTHER): Payer: Self-pay

## 2018-12-23 ENCOUNTER — Ambulatory Visit (HOSPITAL_BASED_OUTPATIENT_CLINIC_OR_DEPARTMENT_OTHER): Payer: 59 | Admitting: Certified Registered Nurse Anesthetist

## 2018-12-23 ENCOUNTER — Encounter: Payer: Self-pay | Admitting: Medical Oncology

## 2018-12-23 ENCOUNTER — Ambulatory Visit (HOSPITAL_BASED_OUTPATIENT_CLINIC_OR_DEPARTMENT_OTHER): Payer: 59 | Admitting: Physician Assistant

## 2018-12-23 ENCOUNTER — Encounter (HOSPITAL_BASED_OUTPATIENT_CLINIC_OR_DEPARTMENT_OTHER)
Admission: RE | Disposition: A | Discharge status: Home / Self Care | Payer: Self-pay | Source: Other Acute Inpatient Hospital | Attending: Urology

## 2018-12-23 ENCOUNTER — Ambulatory Visit (HOSPITAL_BASED_OUTPATIENT_CLINIC_OR_DEPARTMENT_OTHER)
Admission: RE | Admit: 2018-12-23 | Discharge: 2018-12-23 | Disposition: A | Discharge status: Home / Self Care | Payer: 59 | Source: Other Acute Inpatient Hospital | Attending: Urology | Admitting: Urology

## 2018-12-23 DIAGNOSIS — C61 Malignant neoplasm of prostate: Secondary | ICD-10-CM | POA: Diagnosis not present

## 2018-12-23 DIAGNOSIS — I10 Essential (primary) hypertension: Secondary | ICD-10-CM | POA: Diagnosis not present

## 2018-12-23 DIAGNOSIS — Z01818 Encounter for other preprocedural examination: Secondary | ICD-10-CM

## 2018-12-23 DIAGNOSIS — Z1159 Encounter for screening for other viral diseases: Secondary | ICD-10-CM | POA: Insufficient documentation

## 2018-12-23 HISTORY — PX: RADIOACTIVE SEED IMPLANT: SHX5150

## 2018-12-23 HISTORY — PX: CYSTOSCOPY: SHX5120

## 2018-12-23 HISTORY — PX: SPACE OAR INSTILLATION: SHX6769

## 2018-12-23 SURGERY — INSERTION, RADIATION SOURCE, PROSTATE
Anesthesia: General | Site: Rectum

## 2018-12-23 MED ORDER — FLEET ENEMA 7-19 GM/118ML RE ENEM
1.0000 | ENEMA | Freq: Once | RECTAL | Status: DC
  Filled 2018-12-23: qty 1

## 2018-12-23 MED ORDER — EPHEDRINE SULFATE 50 MG/ML IJ SOLN
INTRAMUSCULAR | Status: DC | PRN
  Administered 2018-12-23 (×3): 10 mg via INTRAVENOUS

## 2018-12-23 MED ORDER — MIDAZOLAM HCL 5 MG/5ML IJ SOLN
INTRAMUSCULAR | Status: DC | PRN
  Administered 2018-12-23 (×2): 1 mg via INTRAVENOUS

## 2018-12-23 MED ORDER — FENTANYL CITRATE (PF) 100 MCG/2ML IJ SOLN
25.0000 ug | INTRAMUSCULAR | Status: DC | PRN
  Filled 2018-12-23: qty 1

## 2018-12-23 MED ORDER — KETOROLAC TROMETHAMINE 30 MG/ML IJ SOLN
INTRAMUSCULAR | Status: DC | PRN
  Administered 2018-12-23: 30 mg via INTRAVENOUS

## 2018-12-23 MED ORDER — DEXAMETHASONE SODIUM PHOSPHATE 10 MG/ML IJ SOLN
INTRAMUSCULAR | Status: AC
  Filled 2018-12-23: qty 1

## 2018-12-23 MED ORDER — SODIUM CHLORIDE (PF) 0.9 % IJ SOLN
INTRAMUSCULAR | Status: DC | PRN
  Administered 2018-12-23: 10 mL

## 2018-12-23 MED ORDER — PHENYLEPHRINE 40 MCG/ML (10ML) SYRINGE FOR IV PUSH (FOR BLOOD PRESSURE SUPPORT)
PREFILLED_SYRINGE | INTRAVENOUS | Status: AC
  Filled 2018-12-23: qty 10

## 2018-12-23 MED ORDER — ACETAMINOPHEN 160 MG/5ML PO SOLN
325.0000 mg | ORAL | Status: DC | PRN
  Filled 2018-12-23: qty 20.3

## 2018-12-23 MED ORDER — LIDOCAINE 2% (20 MG/ML) 5 ML SYRINGE
INTRAMUSCULAR | Status: AC
  Filled 2018-12-23: qty 5

## 2018-12-23 MED ORDER — DEXAMETHASONE SODIUM PHOSPHATE 4 MG/ML IJ SOLN
INTRAMUSCULAR | Status: DC | PRN
  Administered 2018-12-23: 10 mg via INTRAVENOUS

## 2018-12-23 MED ORDER — ONDANSETRON HCL 4 MG/2ML IJ SOLN
INTRAMUSCULAR | Status: DC | PRN
  Administered 2018-12-23: 4 mg via INTRAVENOUS

## 2018-12-23 MED ORDER — PHENYLEPHRINE 40 MCG/ML (10ML) SYRINGE FOR IV PUSH (FOR BLOOD PRESSURE SUPPORT)
PREFILLED_SYRINGE | INTRAVENOUS | Status: DC | PRN
  Administered 2018-12-23 (×5): 80 ug via INTRAVENOUS

## 2018-12-23 MED ORDER — EPHEDRINE 5 MG/ML INJ
INTRAVENOUS | Status: AC
  Filled 2018-12-23: qty 10

## 2018-12-23 MED ORDER — PROPOFOL 10 MG/ML IV BOLUS
INTRAVENOUS | Status: DC | PRN
  Administered 2018-12-23: 20 mg via INTRAVENOUS
  Administered 2018-12-23: 150 mg via INTRAVENOUS
  Administered 2018-12-23: 30 mg via INTRAVENOUS

## 2018-12-23 MED ORDER — TRAMADOL HCL 50 MG PO TABS: 50.0000 mg | ORAL_TABLET | Freq: Four times a day (QID) | ORAL | 0 refills | Status: AC | PRN

## 2018-12-23 MED ORDER — OXYCODONE HCL 5 MG PO TABS
5.0000 mg | ORAL_TABLET | Freq: Once | ORAL | Status: DC | PRN
  Filled 2018-12-23: qty 1

## 2018-12-23 MED ORDER — MEPERIDINE HCL 25 MG/ML IJ SOLN
6.2500 mg | INTRAMUSCULAR | Status: DC | PRN
  Filled 2018-12-23: qty 1

## 2018-12-23 MED ORDER — IOHEXOL 300 MG/ML  SOLN
INTRAMUSCULAR | Status: DC | PRN
  Administered 2018-12-23: 7 mL

## 2018-12-23 MED ORDER — GLYCOPYRROLATE 0.2 MG/ML IJ SOLN
INTRAMUSCULAR | Status: DC | PRN
  Administered 2018-12-23: 0.1 mg via INTRAVENOUS

## 2018-12-23 MED ORDER — LIDOCAINE HCL (CARDIAC) PF 100 MG/5ML IV SOSY
PREFILLED_SYRINGE | INTRAVENOUS | Status: DC | PRN
  Administered 2018-12-23: 80 mg via INTRAVENOUS

## 2018-12-23 MED ORDER — KETOROLAC TROMETHAMINE 30 MG/ML IJ SOLN
INTRAMUSCULAR | Status: AC
  Filled 2018-12-23: qty 1

## 2018-12-23 MED ORDER — GLYCOPYRROLATE PF 0.2 MG/ML IJ SOSY
PREFILLED_SYRINGE | INTRAMUSCULAR | Status: AC
  Filled 2018-12-23: qty 1

## 2018-12-23 MED ORDER — OXYCODONE HCL 5 MG/5ML PO SOLN
5.0000 mg | Freq: Once | ORAL | Status: DC | PRN
  Filled 2018-12-23: qty 5

## 2018-12-23 MED ORDER — FENTANYL CITRATE (PF) 100 MCG/2ML IJ SOLN
INTRAMUSCULAR | Status: AC
  Filled 2018-12-23: qty 2

## 2018-12-23 MED ORDER — CEFAZOLIN SODIUM-DEXTROSE 2-4 GM/100ML-% IV SOLN
INTRAVENOUS | Status: AC
  Filled 2018-12-23: qty 100

## 2018-12-23 MED ORDER — FENTANYL CITRATE (PF) 100 MCG/2ML IJ SOLN
INTRAMUSCULAR | Status: DC | PRN
  Administered 2018-12-23 (×2): 50 ug via INTRAVENOUS
  Administered 2018-12-23 (×2): 25 ug via INTRAVENOUS

## 2018-12-23 MED ORDER — MIDAZOLAM HCL 2 MG/2ML IJ SOLN
INTRAMUSCULAR | Status: AC
  Filled 2018-12-23: qty 2

## 2018-12-23 MED ORDER — ONDANSETRON HCL 4 MG/2ML IJ SOLN
INTRAMUSCULAR | Status: AC
  Filled 2018-12-23: qty 2

## 2018-12-23 MED ORDER — CEFAZOLIN SODIUM-DEXTROSE 2-4 GM/100ML-% IV SOLN
2.0000 g | Freq: Once | INTRAVENOUS | Status: AC
  Administered 2018-12-23: 2 g via INTRAVENOUS
  Filled 2018-12-23: qty 100

## 2018-12-23 MED ORDER — LACTATED RINGERS IV SOLN
INTRAVENOUS | Status: DC
  Administered 2018-12-23 (×2): via INTRAVENOUS
  Filled 2018-12-23: qty 1000

## 2018-12-23 MED ORDER — ONDANSETRON HCL 4 MG/2ML IJ SOLN
4.0000 mg | Freq: Once | INTRAMUSCULAR | Status: DC | PRN
  Filled 2018-12-23: qty 2

## 2018-12-23 MED ORDER — KETOROLAC TROMETHAMINE 15 MG/ML IJ SOLN
15.0000 mg | Freq: Once | INTRAMUSCULAR | Status: DC
  Filled 2018-12-23: qty 1

## 2018-12-23 MED ORDER — PROPOFOL 10 MG/ML IV BOLUS
INTRAVENOUS | Status: AC
  Filled 2018-12-23: qty 20

## 2018-12-23 MED ORDER — ACETAMINOPHEN 325 MG PO TABS
325.0000 mg | ORAL_TABLET | ORAL | Status: DC | PRN
  Filled 2018-12-23: qty 2

## 2018-12-23 MED ORDER — SODIUM CHLORIDE 0.9 % IR SOLN
Status: DC | PRN
  Administered 2018-12-23: 1000 mL via INTRAVESICAL

## 2018-12-23 MED FILL — traMADol HCL 50 MG TABS: 50 | 5 days supply | Qty: 20 | Fill #0

## 2018-12-23 SURGICAL SUPPLY — 39 items
BAG URINE DRAINAGE (UROLOGICAL SUPPLIES) ×4 IMPLANT
BLADE CLIPPER SENSICLIP SURGIC (BLADE) ×4 IMPLANT
CATH FOLEY 2WAY SLVR  5CC 16FR (CATHETERS) ×2
CATH FOLEY 2WAY SLVR 5CC 16FR (CATHETERS) ×6 IMPLANT
CATH ROBINSON RED A/P 20FR (CATHETERS) ×4 IMPLANT
CLOTH BEACON ORANGE TIMEOUT ST (SAFETY) ×4 IMPLANT
CONT SPECI 4OZ STER CLIK (MISCELLANEOUS) ×8 IMPLANT
COVER BACK TABLE 60X90IN (DRAPES) ×4 IMPLANT
COVER MAYO STAND STRL (DRAPES) ×4 IMPLANT
COVER WAND RF STERILE (DRAPES) ×4 IMPLANT
DRSG TEGADERM 4X4.75 (GAUZE/BANDAGES/DRESSINGS) ×8 IMPLANT
DRSG TEGADERM 8X12 (GAUZE/BANDAGES/DRESSINGS) ×4 IMPLANT
GAUZE SPONGE 4X4 12PLY STRL (GAUZE/BANDAGES/DRESSINGS) ×4 IMPLANT
GLOVE BIO SURGEON STRL SZ8 (GLOVE) ×4 IMPLANT
GLOVE BIOGEL PI IND STRL 6.5 (GLOVE) ×3 IMPLANT
GLOVE BIOGEL PI IND STRL 7.0 (GLOVE) IMPLANT
GLOVE BIOGEL PI IND STRL 8 (GLOVE) IMPLANT
GLOVE BIOGEL PI INDICATOR 6.5 (GLOVE) ×1
GLOVE BIOGEL PI INDICATOR 7.0 (GLOVE)
GLOVE BIOGEL PI INDICATOR 8 (GLOVE)
GLOVE ECLIPSE 8.0 STRL XLNG CF (GLOVE) IMPLANT
GOWN STRL REUS W/TWL XL LVL3 (GOWN DISPOSABLE) ×12 IMPLANT
HOLDER FOLEY CATH W/STRAP (MISCELLANEOUS) ×4 IMPLANT
IMPL SPACEOAR SYSTEM 10ML (Spacer) ×3 IMPLANT
IMPLANT SPACEOAR SYSTEM 10ML (Spacer) ×4 IMPLANT
IV NS 1000ML (IV SOLUTION) ×1
IV NS 1000ML BAXH (IV SOLUTION) ×3 IMPLANT
KIT TURNOVER CYSTO (KITS) ×4 IMPLANT
MANIFOLD NEPTUNE II (INSTRUMENTS) IMPLANT
MARKER SKIN DUAL TIP RULER LAB (MISCELLANEOUS) ×4 IMPLANT
PACK CYSTO (CUSTOM PROCEDURE TRAY) ×4 IMPLANT
SURGILUBE 2OZ TUBE FLIPTOP (MISCELLANEOUS) ×4 IMPLANT
SYR 10ML LL (SYRINGE) ×8 IMPLANT
TOWEL OR 17X26 10 PK STRL BLUE (TOWEL DISPOSABLE) ×8 IMPLANT
UNDERPAD 30X30 (UNDERPADS AND DIAPERS) ×8 IMPLANT
WATER STERILE IRR 3000ML UROMA (IV SOLUTION) ×4 IMPLANT
WATER STERILE IRR 500ML POUR (IV SOLUTION) ×4 IMPLANT
i seed agx100 ×220 IMPLANT
orthopaedic gloves 8 1/2 ×20 IMPLANT

## 2018-12-23 NOTE — Anesthesia Procedure Notes (Signed)
Procedure Name: LMA Insertion Date/Time: 12/23/2018 1:46 PM Performed by: Bufford Spikes, CRNA Pre-anesthesia Checklist: Patient identified, Emergency Drugs available, Suction available and Patient being monitored Patient Re-evaluated:Patient Re-evaluated prior to induction Oxygen Delivery Method: Circle system utilized Preoxygenation: Pre-oxygenation with 100% oxygen Induction Type: IV induction Ventilation: Mask ventilation without difficulty LMA: LMA inserted LMA Size: 4.0 Number of attempts: 1 Airway Equipment and Method: Bite block Placement Confirmation: positive ETCO2 Tube secured with: Tape Dental Injury: Teeth and Oropharynx as per pre-operative assessment

## 2018-12-23 NOTE — H&P (Signed)
Urology Admission H&P  Chief Complaint: prostate cancer  History of Present Illness: Juan Ponce is a 68yo with a hx of prostate cancer here for brachytherapy. He has mild LUTS. No fevers/chills/sweats. No nausea/vomiting  Past Medical History:  Diagnosis Date  . Hypertension   . Prostate cancer [facility])    Past Surgical History:  Procedure Laterality Date  . NO PAST SURGERIES    . PROSTATE BIOPSY      Home Medications:  Current Facility-Administered Medications  Medication Dose Route Frequency Provider Last Rate Last Dose  . ceFAZolin (ANCEF) IVPB 2g/100 mL premix  2 g Intravenous Once Cleon Gustin, MD      . lactated ringers infusion   Intravenous Continuous Myrtie Soman, MD 50 mL/hr at 12/23/18 1116    . [START ON 12/24/2018] sodium phosphate (FLEET) 7-19 GM/118ML enema 1 enema  1 enema Rectal Once , Candee Furbish, MD       Allergies: No Known Allergies  Family History  Problem Relation Age of Onset  . Prostate cancer Brother   . Breast cancer Sister   . Lung cancer Brother   . Colon cancer Neg Hx   . Pancreatic cancer Neg Hx    Social History:  reports that he has never smoked. He has never used smokeless tobacco. He reports current alcohol use. He reports that he does not use drugs.  Review of Systems  All other systems reviewed and are negative.   Physical Exam:  Vital signs in last 24 hours: Temp:  [98 F (36.7 C)] 98 F (36.7 C) (07/23 1049) Pulse Rate:  [91] 91 (07/23 1049) Resp:  [16] 16 (07/23 1049) BP: (157)/(100) 157/100 (07/23 1049) SpO2:  [100 %] 100 % (07/23 1049) Weight:  [84.6 kg] 84.6 kg (07/23 1049) Physical Exam  Constitutional: He is oriented to person, place, and time. He appears well-developed and well-nourished.  HENT:  Head: Normocephalic and atraumatic.  Eyes: Pupils are equal, round, and reactive to light. EOM are normal.  Neck: Normal range of motion. No thyromegaly present.  Cardiovascular: Normal rate and regular rhythm.   Respiratory: Effort normal. No respiratory distress.  GI: Soft. He exhibits no distension.  Musculoskeletal: Normal range of motion.        General: No edema.  Neurological: He is alert and oriented to person, place, and time.  Skin: Skin is warm and dry.  Psychiatric: He has a normal mood and affect. His behavior is normal. Judgment and thought content normal.    Laboratory Data:  No results found for this or any previous visit (from the past 24 hour(s)). Recent Results (from the past 240 hour(s))  SARS Coronavirus 2 (Performed in [place] hospital lab)     Status: None   Collection Time: 12/20/18 10:45 AM   Specimen: Nasal Swab  Result Value Ref Range Status   SARS Coronavirus 2 NEGATIVE NEGATIVE Final    Comment: (NOTE) SARS-CoV-2 target nucleic acids are NOT DETECTED. The SARS-CoV-2 RNA is generally detectable in upper and lower respiratory specimens during the acute phase of infection. Negative results do not preclude SARS-CoV-2 infection, do not rule out co-infections with other pathogens, and should not be used as the sole basis for treatment or other patient management decisions. Negative results must be combined with clinical observations, patient history, and epidemiological information. The expected result is Negative. Fact Sheet for Patients: SugarRoll.be Fact Sheet for Healthcare Providers: https://www.woods-mathews.com/ This test is not yet approved or cleared by the [place] FDA and  has been authorized for detection and/or diagnosis of SARS-CoV-2 by FDA under an Emergency Use Authorization (EUA). This EUA will remain  in effect (meaning this test can be used) for the duration of the COVID-19 declaration under Section 56 4(b)(1) of the Act, 21 U.S.C. section 360bbb-3(b)(1), unless the authorization is terminated or revoked sooner. Performed at [facility], [place] [address] [place], [place] [address]     Creatinine: Recent Labs    12/20/18 0973  CREATININE 0.87   Baseline Creatinine: 0.87  Impression/Assessment:  68yo with prostate cancer  Plan:  The risks/benefits/alternatives to brachytherapy with SpaceOAR was explained to the patient and he understands and wishes to proceed with surgery  Nicolette Bang 12/23/2018, 1:06 PM

## 2018-12-23 NOTE — Progress Notes (Signed)
  Radiation Oncology         [phone] ________________________________  Name: Juan Ponce MRN: [id number]  Date: 12/23/2018  DOB: May 01, 1951       Prostate Seed Implant  LO:VFIEPPI, Jenny Reichmann, MD  No ref. provider found  DIAGNOSIS:  68 y.o. gentleman with Stage T1c adenocarcinoma of the prostate with Gleason score of 4+3, and PSA of 5.2.    ICD-10-CM   1. Preop testing  Z01.818 DG Chest 2 View    DG Chest 2 View    PROCEDURE: Insertion of radioactive I-125 seeds into the prostate gland.  RADIATION DOSE: 145 Gy, definitive therapy.  TECHNIQUE: [name] was brought to the operating room with the urologist. He was placed in the dorsolithotomy position. He was catheterized and a rectal tube was inserted. The perineum was shaved, prepped and draped. The ultrasound probe was then introduced into the rectum to see the prostate gland.  TREATMENT DEVICE: A needle grid was attached to the ultrasound probe stand and anchor needles were placed.  3D PLANNING: The prostate was imaged in 3D using a sagittal sweep of the prostate probe. These images were transferred to the planning computer. There, the prostate, urethra and rectum were defined on each axial reconstructed image. Then, the software created an optimized 3D plan and a few seed positions were adjusted. The quality of the plan was reviewed using [facility] information for the target and the following two organs at risk:  Urethra and Rectum.  Then the accepted plan was printed and handed off to the radiation therapist.  Under my supervision, the custom loading of the seeds and spacers was carried out and loaded into sealed vicryl sleeves.  These pre-loaded needles were then placed into the needle holder.Marland Kitchen  PROSTATE VOLUME STUDY:  Using transrectal ultrasound the volume of the prostate was verified to be 44.6 cc.  SPECIAL TREATMENT PROCEDURE/SUPERVISION AND HANDLING: The pre-loaded needles were then delivered under sagittal guidance. A  total of 19 needles were used to deposit 55 seeds in the prostate gland. The individual seed activity was 0.625 mCi.  SpaceOAR:  Yes  COMPLEX SIMULATION: At the end of the procedure, an anterior radiograph of the pelvis was obtained to document seed positioning and count. Cystoscopy was performed to check the urethra and bladder.  MICRODOSIMETRY: At the end of the procedure, the patient was emitting 0.107 mR/hr at 1 meter. Accordingly, he was considered safe for hospital discharge.  PLAN: The patient will return to the radiation oncology clinic for post implant CT dosimetry in three weeks.   ________________________________  Sheral Apley Tammi Klippel, M.D.

## 2018-12-23 NOTE — Op Note (Signed)
PRE-OPERATIVE DIAGNOSIS:  Adenocarcinoma of the prostate  POST-OPERATIVE DIAGNOSIS:  Same  PROCEDURE:  Procedure(s): 1. I-125 radioactive seed implantation 2. Cystoscopy 3. Placement of SpaceOAR  SURGEON:  Surgeon(s): Nicolette Bang, MD  Radiation oncologist: Tyler Pita, MD  ANESTHESIA:  General  EBL:  Minimal  DRAINS: 77 French Foley catheter  INDICATION: Juan Ponce is a 68 year old with a history of T1c prostate cancer. After discussing treatment options he has elected to proceed with brachytherapy and SpaceOAR  Description of procedure: After informed consent the patient was brought to the major OR, placed on the table and administered general anesthesia. He was then moved to the modified lithotomy position with his perineum perpendicular to the floor. His perineum and genitalia were then sterilely prepped. An official timeout was then performed. A 16 French Foley catheter was then placed in the bladder and filled with dilute contrast, a rectal tube was placed in the rectum and the transrectal ultrasound probe was placed in the rectum and affixed to the stand. He was then sterilely draped.  Real time ultrasonography was used along with the seed planning software [employer] Prostate vs. 4.2.21. This was used to develop the seed plan including the number of needles as well as number of seeds required for complete and adequate coverage. Real-time ultrasonography was then used along with the previously developed plan and the Nucletron device to implant a total of 55 seeds using 19 needles. This proceeded without difficulty or complication.  We then proceeded to mix the SpaceOAR using the kit supplied from the manufacturer. Once this was complete we placed a sinal needle into the perirectal fat between the rectum and the prostate. Once this was accomplished we injected 2cc of normal saline to hydrodissect the plain. We then instilled the the SpaceOAR through the spinal needle and  noted good distribution in the perirectal fat.  .    A Foley catheter was then removed as well as the transrectal ultrasound probe and rectal probe. Flexible cystoscopy was then performed using the 17 French flexible scope which revealed a normal urethra throughout its length down to the sphincter which appeared intact. The prostatic urethra revealed bilobar hypertrophy but no evidence of obstruction, seeds, spacers or lesions. The bladder was then entered and fully and systematically inspected. The ureteral orifices were noted to be of normal configuration and position. The mucosa revealed no evidence of tumors. There were also no stones identified within the bladder. I noted no seeds or spacers on the floor of the bladder and retroflexion of the scope revealed no seeds protruding from the base of the prostate.  The cystoscope was then removed and a new 107 French Foley catheter was then inserted and the balloon was filled with 10 cc of sterile water. This was connected to closed system drainage and the patient was awakened and taken to recovery room in stable and satisfactory condition. He tolerated procedure well and there were no intraoperative complications.

## 2018-12-23 NOTE — Anesthesia Preprocedure Evaluation (Signed)
Anesthesia Evaluation  Patient identified by MRN, date of birth, ID band Patient awake    Reviewed: Allergy & Precautions, NPO status , Patient's Chart, lab work & pertinent test results  Airway Mallampati: I       Dental no notable dental hx.    Pulmonary neg pulmonary ROS,    Pulmonary exam normal        Cardiovascular hypertension, Pt. on medications Normal cardiovascular exam Rhythm:Regular Rate:Normal     Neuro/Psych negative neurological ROS  negative psych ROS   GI/Hepatic negative GI ROS, Neg liver ROS,   Endo/Other  negative endocrine ROS  Renal/GU negative Renal ROS  negative genitourinary   Musculoskeletal negative musculoskeletal ROS (+)   Abdominal Normal abdominal exam  (+)   Peds  Hematology negative hematology ROS (+)   Anesthesia Other Findings   Reproductive/Obstetrics                             Anesthesia Physical Anesthesia Plan  ASA: II  Anesthesia Plan: General   Post-op Pain Management:    Induction: Intravenous  PONV Risk Score and Plan: 4 or greater and Ondansetron and Dexamethasone  Airway Management Planned: LMA  Additional Equipment:   Intra-op Plan:   Post-operative Plan:   Informed Consent: I have reviewed the patients History and Physical, chart, labs and discussed the procedure including the risks, benefits and alternatives for the proposed anesthesia with the patient or authorized representative who has indicated his/her understanding and acceptance.     Dental advisory given  Plan Discussed with: CRNA  Anesthesia Plan Comments:         Anesthesia Quick Evaluation

## 2018-12-23 NOTE — Discharge Instructions (Signed)
Indwelling Urinary Catheter Care, Adult °An indwelling urinary catheter is a thin tube that is put into your bladder. The tube helps to drain pee (urine) out of your body. The tube goes in through your urethra. Your urethra is where pee comes out of your body. Your pee will come out through the catheter, then it will go into a bag (drainage bag). °Take good care of your catheter so it will work well. °How to wear your catheter and bag °Supplies needed °· Sticky tape (adhesive tape) or a leg strap. °· Alcohol wipe or soap and water (if you use tape). °· A clean towel (if you use tape). °· Large overnight bag. °· Smaller bag (leg bag). °Wearing your catheter °Attach your catheter to your leg with tape or a leg strap. °· Make sure the catheter is not pulled tight. °· If a leg strap gets wet, take it off and put on a dry strap. °· If you use tape to hold the bag on your leg: °1. Use an alcohol wipe or soap and water to wash your skin where the tape made it sticky before. °2. Use a clean towel to pat-dry that skin. °3. Use new tape to make the bag stay on your leg. °Wearing your bags °You should have been given a large overnight bag. °· You may wear the overnight bag in the day or night. °· Always have the overnight bag lower than your bladder.  Do not let the bag touch the floor. °· Before you go to sleep, put a clean plastic bag in a wastebasket. Then hang the overnight bag inside the wastebasket. °You should also have a smaller leg bag that fits under your clothes. °· Always wear the leg bag below your knee. °· Do not wear your leg bag at night. °How to care for your skin and catheter °Supplies needed °· A clean washcloth. °· Water and mild soap. °· A clean towel. °Caring for your skin and catheter ° °  ° °· Clean the skin around your catheter every day: °? Wash your hands with soap and water. °? Wet a clean washcloth in warm water and mild soap. °? Clean the skin around your urethra. °? If you are male: °? Gently  spread the folds of skin around your vagina (labia). °? With the washcloth in your other hand, wipe the inner side of your labia on each side. Wipe from front to back. °? If you are male: °? Pull back any skin that covers the end of your penis (foreskin). °? With the washcloth in your other hand, wipe your penis in small circles. Start wiping at the tip of your penis, then move away from the catheter. °? Move the foreskin back in place, if needed. °? With your free hand, hold the catheter close to where it goes into your body. °? Keep holding the catheter during cleaning so it does not get pulled out. °? With the washcloth in your other hand, clean the catheter. °? Only wipe downward on the catheter. °? Do not wipe upward toward your body. Doing this may push germs into your urethra and cause infection. °? Use a clean towel to pat-dry the catheter and the skin around it. Make sure to wipe off all soap. °? Wash your hands with soap and water. °· Shower every day. Do not take baths. °· Do not use cream, ointment, or lotion on the area where the catheter goes into your body, unless your doctor tells you   to. °· Do not use powders, sprays, or lotions on your genital area. °· Check your skin around the catheter every day for signs of infection. Check for: °? Redness, swelling, or pain. °? Fluid or blood. °? Warmth. °? Pus or a bad smell. °How to empty the bag °Supplies needed °· Rubbing alcohol. °· Gauze pad or cotton ball. °· Tape or a leg strap. °Emptying the bag °Pour the pee out of your bag when it is ?-½ full, or at least 2-3 times a day. Do this for your overnight bag and your leg bag. °1. Wash your hands with soap and water. °2. Separate (detach) the bag from your leg. °3. Hold the bag over the toilet or a clean pail. Keep the bag lower than your hips and bladder. This is so the pee (urine) does not go back into the tube. °4. Open the pour spout. It is at the bottom of the bag. °5. Empty the pee into the toilet or  pail. Do not let the pour spout touch any surface. °6. Put rubbing alcohol on a gauze pad or cotton ball. °7. Use the gauze pad or cotton ball to clean the pour spout. °8. Close the pour spout. °9. Attach the bag to your leg with tape or a leg strap. °10. Wash your hands with soap and water. °Follow instructions for cleaning the drainage bag: °· From the product maker. °· As told by your doctor. °How to change the bag °Supplies needed °· Alcohol wipes. °· A clean bag. °· Tape or a leg strap. °Changing the bag °Replace your bag when it starts to leak, smell bad, or look dirty. °1. Wash your hands with soap and water. °2. Separate the dirty bag from your leg. °3. Pinch the catheter with your fingers so that pee does not spill out. °4. Separate the catheter tube from the bag tube where these tubes connect (at the connection valve). Do not let the tubes touch any surface. °5. Clean the end of the catheter tube with an alcohol wipe. Use a different alcohol wipe to clean the end of the bag tube. °6. Connect the catheter tube to the tube of the clean bag. °7. Attach the clean bag to your leg with tape or a leg strap. Do not make the bag tight on your leg. °8. Wash your hands with soap and water. °General rules ° °· Never pull on your catheter. Never try to take it out. Doing that can hurt you. °· Always wash your hands before and after you touch your catheter or bag. Use a mild, fragrance-free soap. If you do not have soap and water, use hand sanitizer. °· Always make sure there are no twists or bends (kinks) in the catheter tube. °· Always make sure there are no leaks in the catheter or bag. °· Drink enough fluid to keep your pee pale yellow. °· Do not take baths, swim, or use a hot tub. °· If you are male, wipe from front to back after you poop (have a bowel movement). °Contact a doctor if: °· Your pee is cloudy. °· Your pee smells worse than usual. °· Your catheter gets clogged. °· Your catheter leaks. °· Your bladder  feels full. °Get help right away if: °· You have redness, swelling, or pain where the catheter goes into your body. °· You have fluid, blood, pus, or a bad smell coming from the area where the catheter goes into your body. °· Your skin feels warm where   the catheter goes into your body.  You have a fever.  You have pain in your: ? Belly (abdomen). ? Legs. ? Lower back. ? Bladder.  You see blood in the catheter.  Your pee is pink or red.  You feel sick to your stomach (nauseous).  You throw up (vomit).  You have chills.  Your pee is not draining into the bag.  Your catheter gets pulled out. Summary  An indwelling urinary catheter is a thin tube that is placed into the bladder to help drain pee (urine) out of the body.  The catheter is placed into the part of the body that drains pee from the bladder (urethra).  Taking good care of your catheter will keep it working properly and help prevent problems.  Always wash your hands before and after touching your catheter or bag.  Never pull on your catheter or try to take it out. This information is not intended to replace advice given to you by your health care provider. Make sure you discuss any questions you have with your health care provider. Document Released: 09/13/2012 Document Revised: 09/10/2018 Document Reviewed: 01/02/2017 Elsevier Patient Education  [place] Instructions   Activity:    Rest for the remainder of the day.  Do not drive or operate equipment today.  You may resume normal activities in a few days as instructed by your physician, without risk of harmful radiation exposure to those around you, provided you follow the time and distance precautions on the Radiation Oncology Instruction Sheet.   Meals: Drink plenty of lipuids and eat light foods, such as gelatin or soup this evening .  You may return to normal meal plan tomorrow.  Return To Work: You may return to  work as instructed by Naval [employer].  Special Instruction:   If any seeds are found, use tweezers to pick up seeds and place in a glass container of any kind and bring to your physician's office.  Call your physician if any of these symptoms occur:   Persistent or heavy bleeding  Urine stream diminishes or stops completely after catheter is removed  Fever equal to or greater than 101 degrees F  Cloudy urine with a strong foul odor  Severe pain  You may feel some burning pain and/or hesitancy when you urinate after the catheter is removed.  These symptoms may increase over the next few weeks, but should diminish within forur to six weeks.  Applying moist heat to the lower abdomen or a hot tub bath may help relieve the pain.  If the discomfort becomes severe, please call your physician for additional medications.   Post Anesthesia Home Care Instructions  Activity: Get plenty of rest for the remainder of the day. A responsible individual must stay with you for 24 hours following the procedure.  For the next 24 hours, DO NOT: -Drive a car -Paediatric nurse -Drink alcoholic beverages -Take any medication unless instructed by your physician -Make any legal decisions or sign important papers.  Meals: Start with liquid foods such as gelatin or soup. Progress to regular foods as tolerated. Avoid greasy, spicy, heavy foods. If nausea and/or vomiting occur, drink only clear liquids until the nausea and/or vomiting subsides. Call your physician if vomiting continues.  Special Instructions/Symptoms: Your throat may feel dry or sore from the anesthesia or the breathing tube placed in your throat during surgery. If this causes discomfort, gargle with warm salt water. The discomfort should disappear  within 24 hours.  If you had a scopolamine patch placed behind your ear for the management of post- operative nausea and/or vomiting:  1. The medication in the patch is effective for 72 hours,  after which it should be removed.  Wrap patch in a tissue and discard in the trash. Wash hands thoroughly with soap and water. 2. You may remove the patch earlier than 72 hours if you experience unpleasant side effects which may include dry mouth, dizziness or visual disturbances. 3. Avoid touching the patch. Wash your hands with soap and water after contact with the patch.

## 2018-12-23 NOTE — Transfer of Care (Signed)
Immediate Anesthesia Transfer of Care Note  Patient: Juan Ponce  Procedure(s) Performed: RADIOACTIVE SEED IMPLANT/BRACHYTHERAPY IMPLANT (N/A Prostate) SPACE OAR INSTILLATION (N/A Rectum) CYSTOSCOPY FLEXIBLE (Bladder)  Patient Location: PACU  Anesthesia Type:General  Level of Consciousness: awake, alert  and oriented  Airway & Oxygen Therapy: Patient Spontanous Breathing and Patient connected to nasal cannula oxygen  Post-op Assessment: Report given to RN and Post -op Vital signs reviewed and stable  Post vital signs: Reviewed and stable  Last Vitals:  Vitals Value Taken Time  BP 141/86 12/23/18 1500  Temp    Pulse 94 12/23/18 1503  Resp 22 12/23/18 1503  SpO2 96 % 12/23/18 1503  Vitals shown include unvalidated device data.  Last Pain:  Vitals:   12/23/18 1049  TempSrc: Oral  PainSc: 0-No pain      Patients Stated Pain Goal: 6 (56/21/30 8657)  Complications: No apparent anesthesia complications

## 2018-12-24 ENCOUNTER — Encounter (HOSPITAL_BASED_OUTPATIENT_CLINIC_OR_DEPARTMENT_OTHER): Payer: Self-pay | Admitting: Urology

## 2018-12-24 NOTE — Anesthesia Postprocedure Evaluation (Signed)
Anesthesia Post Note  Patient: Juan Ponce  Procedure(s) Performed: RADIOACTIVE SEED IMPLANT/BRACHYTHERAPY IMPLANT (N/A Prostate) SPACE OAR INSTILLATION (N/A Rectum) CYSTOSCOPY FLEXIBLE (Bladder)     Patient location during evaluation: PACU Anesthesia Type: General Level of consciousness: awake Pain management: pain level controlled Vital Signs Assessment: post-procedure vital signs reviewed and stable Respiratory status: spontaneous breathing Cardiovascular status: stable Postop Assessment: no apparent nausea or vomiting Anesthetic complications: no    Last Vitals:  Vitals:   12/23/18 1600 12/23/18 1653  BP: (!) 145/83 (!) 145/83  Pulse: 80 64  Resp: 16 14  Temp:  36.7 C  SpO2: 93% 94%    Last Pain:  Vitals:   12/23/18 1653  TempSrc:   PainSc: 0-No pain   Pain Goal: Patients Stated Pain Goal: 6 (12/23/18 1049)                 Huston Foley

## 2018-12-29 ENCOUNTER — Telehealth: Payer: Self-pay | Admitting: *Deleted

## 2018-12-29 NOTE — Telephone Encounter (Signed)
RETURNED PATIENT'S PHONE CALL, SPOKE WITH PATIENT. ?

## 2018-12-30 MED FILL — OLMESARTAN MEDOXOMIL 40 MG: 40 | 30 days supply | Qty: 30 | Fill #2

## 2018-12-31 ENCOUNTER — Other Ambulatory Visit: Payer: Self-pay

## 2018-12-31 ENCOUNTER — Ambulatory Visit (INDEPENDENT_AMBULATORY_CARE_PROVIDER_SITE_OTHER): Payer: 59 | Admitting: Urology

## 2018-12-31 DIAGNOSIS — C61 Malignant neoplasm of prostate: Secondary | ICD-10-CM | POA: Diagnosis not present

## 2019-01-04 ENCOUNTER — Telehealth: Payer: Self-pay | Admitting: *Deleted

## 2019-01-04 NOTE — Telephone Encounter (Signed)
CALLED PATIENT TO REMIND OF POST SEED APPTS. FOR 01-05-19, SPOKE WITH PATIENT AND HE IS AWARE OF THESE APPTS.

## 2019-01-05 ENCOUNTER — Telehealth: Payer: Self-pay | Admitting: *Deleted

## 2019-01-05 ENCOUNTER — Other Ambulatory Visit: Payer: Self-pay

## 2019-01-05 ENCOUNTER — Other Ambulatory Visit: Payer: Self-pay | Admitting: Urology

## 2019-01-05 ENCOUNTER — Encounter: Payer: Self-pay | Admitting: Urology

## 2019-01-05 ENCOUNTER — Ambulatory Visit
Admission: RE | Admit: 2019-01-05 | Discharge: 2019-01-05 | Disposition: A | Discharge status: Home / Self Care | Payer: 59 | Source: Ambulatory Visit | Attending: Urology | Admitting: Urology

## 2019-01-05 ENCOUNTER — Ambulatory Visit
Admission: RE | Admit: 2019-01-05 | Discharge: 2019-01-05 | Disposition: A | Discharge status: Home / Self Care | Payer: 59 | Source: Ambulatory Visit | Attending: Radiation Oncology | Admitting: Radiation Oncology

## 2019-01-05 VITALS — BP 162/99 | HR 90 | Temp 98.0°F | Resp 18 | Wt 189.0 lb

## 2019-01-05 DIAGNOSIS — Z801 Family history of malignant neoplasm of trachea, bronchus and lung: Secondary | ICD-10-CM | POA: Insufficient documentation

## 2019-01-05 DIAGNOSIS — R3911 Hesitancy of micturition: Secondary | ICD-10-CM | POA: Diagnosis not present

## 2019-01-05 DIAGNOSIS — Z803 Family history of malignant neoplasm of breast: Secondary | ICD-10-CM | POA: Diagnosis not present

## 2019-01-05 DIAGNOSIS — Z923 Personal history of irradiation: Secondary | ICD-10-CM | POA: Diagnosis not present

## 2019-01-05 DIAGNOSIS — I10 Essential (primary) hypertension: Secondary | ICD-10-CM | POA: Insufficient documentation

## 2019-01-05 DIAGNOSIS — R350 Frequency of micturition: Secondary | ICD-10-CM | POA: Diagnosis not present

## 2019-01-05 DIAGNOSIS — C61 Malignant neoplasm of prostate: Secondary | ICD-10-CM | POA: Insufficient documentation

## 2019-01-05 DIAGNOSIS — Z79899 Other long term (current) drug therapy: Secondary | ICD-10-CM | POA: Diagnosis not present

## 2019-01-05 MED ORDER — SILODOSIN 8 MG PO CAPS: 8.0000 mg | ORAL_CAPSULE | Freq: Every day | ORAL | 5 refills | Status: DC

## 2019-01-05 MED FILL — SILODOSIN 8 MG CAPS: 8 | 30 days supply | Qty: 30 | Fill #0

## 2019-01-05 NOTE — Telephone Encounter (Signed)
Called patient to inform of MRI for 01-06-19- arrival time- 6:30 am @ [facility], no restrictions to test, test to begin @ 7 am, spoke with patient and he is aware of this test

## 2019-01-05 NOTE — Progress Notes (Signed)
Radiation Oncology         [phone] ________________________________  Name: CHARELS STAMBAUGH MRN: [id number]  Date: 01/05/2019  DOB: [birth year]  Post-Seed Follow-Up Visit Note  CC: Sharilyn Sites, MD  Cleon Gustin, MD  Diagnosis:   68 y.o. gentleman with Stage T1c adenocarcinoma of the prostate with Gleason score of 4+3, and PSA of 5.2.    ICD-10-CM   1. Malignant neoplasm of prostate (HCC)  C61     Interval Since Last Radiation:  2 weeks 12/23/18:  Insertion of radioactive I-125 seeds into the prostate gland; 145 Gy, definitive therapy with placement of SpaceOAR gel.  Narrative:  The patient returns today for routine follow-up.  He is complaining of increased urinary frequency and urinary hesitation symptoms. He filled out a questionnaire regarding urinary function today providing and overall IPSS score of 15 characterizing his symptoms as moderate with weak stream, hesitancy, dysuria at beginning of stream, straining to start, intermittency and frequency/urgency.  His pre-implant score was 8. He denies any abdominal pain or bowel symptoms.  He reports a healthy appetite and is maintaining his weight.  He had a recent follow-up at [facility] urology on Friday, 12/31/2018 for Foley catheter removal but does not have any further scheduled follow-up to his knowledge.  He has continued to see occasional bright red blood at the beginning of his stream since the time of catheter removal but denies any passage of clots.  He specifically denies fevers, chills or night sweats.  ALLERGIES:  has No Known Allergies.  Meds: Current Outpatient Medications  Medication Sig Dispense Refill  . DILT-XR 240 MG 24 hr capsule     . olmesartan (BENICAR) 40 MG tablet     . Telmisartan-amLODIPine 40-10 MG TABS   2  . silodosin (RAPAFLO) 8 MG CAPS capsule Take 1 capsule (8 mg total) by mouth daily after supper. 30 capsule 5  . traMADol (ULTRAM) 50 MG tablet Take 1 tablet (50 mg total) by mouth every 6  (six) hours as needed. (Patient not taking: Reported on 01/05/2019) 20 tablet 0   No current facility-administered medications for this encounter.     Physical Findings: In general this is a well appearing African-American male in no acute distress. He's alert and oriented x4 and appropriate throughout the examination. Cardiopulmonary assessment is negative for acute distress and he exhibits normal effort.   Lab Findings: Lab Results  Component Value Date   WBC 7.0 12/20/2018   HGB 13.7 12/20/2018   HCT 41.2 12/20/2018   MCV 97.4 12/20/2018   PLT 246 12/20/2018    Radiographic Findings:  Patient underwent CT imaging in our clinic for post implant dosimetry. The CT will be reviewed by Dr. Tammi Klippel to confirm there is an adequate distribution of radioactive seeds throughout the prostate gland and ensure that there are no seeds in or near the rectum. His scheduled for prostate MRI at 7 AM on 01/06/2019 and those images will be fused with his CT images for further evaluation. We suspect the final radiation plan and dosimetry will show appropriate coverage of the prostate gland. He understands that we will call and inform him of any unexpected findings on further review of his imaging and dosimetry.  Impression/Plan: 68 y.o. gentleman with Stage T1c adenocarcinoma of the prostate with Gleason score of 4+3, and PSA of 5.2. The patient is recovering from the effects of radiation. His urinary symptoms should gradually improve over the next 4-6 months. We talked about this today. He is  encouraged by his improvement already and is otherwise pleased with his outcome.  I have prescribed Rapaflo 8 mg for him to begin taking with dinner each night to see if this will further improve his LUTS.  We also talked about long-term follow-up for prostate cancer following seed implant. He understands that ongoing PSA determinations and digital rectal exams will help perform surveillance to rule out disease recurrence. He  had a follow up appointment scheduled with Dr. Alyson Ingles on 12/31/2018 but is unaware of any further scheduled follow-up visits to date. He understands what to expect with his PSA measures going forward and would anticipate a follow-up visit with PSA assessment sometime in October or November with Dr. Alyson Ingles. Patient was also educated today about some of the long-term effects from radiation including a small risk for rectal bleeding and possibly erectile dysfunction. We talked about some of the general management approaches to these potential complications. However, I did encourage the patient to contact our office or return at any point if he has questions or concerns related to his previous radiation and prostate cancer.    Nicholos Johns, PA-C

## 2019-01-05 NOTE — Progress Notes (Signed)
  Radiation Oncology         [phone] ________________________________  Name: Juan Ponce MRN: [id number]  Date: 01/05/2019  DOB: [birth year]  COMPLEX SIMULATION NOTE  NARRATIVE:  The patient was brought to the [facility] today following prostate seed implantation approximately one month ago.  Identity was confirmed.  All relevant records and images related to the planned course of therapy were reviewed.  Then, the patient was set-up supine.  CT images were obtained.  The CT images were loaded into the planning software.  Then the prostate and rectum were contoured.  Treatment planning then occurred.  The implanted iodine 125 seeds were identified by the physics staff for projection of radiation distribution  I have requested : 3D Simulation  I have requested a DVH of the following structures: Prostate and rectum.    ________________________________  Sheral Apley Tammi Klippel, M.D.

## 2019-01-05 NOTE — Progress Notes (Signed)
Juan Ponce is here for a follow-up Post seed appointment today.The patient has a MRI scheduled for tomorrow.Patient reports scant bright red hematuria. Patient reports some dysuria.Patient states that he does not empty his bladder. Patient reports difficulty with starting his stream. Patient denies any urgency or leakage. Patient denies any issues with his bowels.Patient reports a weak stream. Patient went to his urologist on Friday and they took out his cather.Patient states that he does not have any other appointments scheduled for the urologist.Patients blood pressure was elevated ,denies a headaches ,chest pain, or numbness or tingling in his extremities. Patient states that he has taken his blood pressure medication. Vitals:   01/05/19 1440  BP: (!) 162/99  Pulse: 90  Resp: 18  Temp: 98 F (36.7 C)  TempSrc: Oral  SpO2: 99%  Weight: 189 lb (85.7 kg)

## 2019-01-05 NOTE — Telephone Encounter (Signed)
xxxx 

## 2019-01-06 ENCOUNTER — Ambulatory Visit (HOSPITAL_COMMUNITY)
Admission: RE | Admit: 2019-01-06 | Discharge: 2019-01-06 | Disposition: A | Discharge status: Home / Self Care | Payer: 59 | Source: Ambulatory Visit | Attending: Urology | Admitting: Urology

## 2019-01-06 DIAGNOSIS — C61 Malignant neoplasm of prostate: Secondary | ICD-10-CM | POA: Diagnosis not present

## 2019-01-31 MED FILL — DILT-XR 240 MG CAP SA: 240 | 90 days supply | Qty: 90 | Fill #1

## 2019-01-31 MED FILL — OLMESARTAN MEDOXOMIL 40 MG: 40 | 30 days supply | Qty: 30 | Fill #3

## 2019-02-01 ENCOUNTER — Encounter: Payer: Self-pay | Admitting: Radiation Oncology

## 2019-02-01 DIAGNOSIS — C61 Malignant neoplasm of prostate: Secondary | ICD-10-CM | POA: Diagnosis not present

## 2019-02-03 MED FILL — SILODOSIN 8 MG CAPS: 8 | 30 days supply | Qty: 30 | Fill #1

## 2019-02-04 NOTE — Progress Notes (Signed)
  Radiation Oncology         [phone] ________________________________  Name: Juan Ponce MRN: [id number]  Date: 02/01/2019  DOB: 1950/07/26  3D Planning Note   Prostate Brachytherapy Post-Implant Dosimetry  Diagnosis: 68 y.o. gentleman with Stage T1c adenocarcinoma of the prostate with Gleason score of 4+3, and PSA of 5.2  Narrative: On a previous date, Juan Ponce returned following prostate seed implantation for post implant planning. He underwent CT scan complex simulation to delineate the three-dimensional structures of the pelvis and demonstrate the radiation distribution.  Since that time, the seed localization, and complex isodose planning with dose volume histograms have now been completed.  Results:   Prostate Coverage - The dose of radiation delivered to the 90% or more of the prostate gland (D90) was 120.05% of the prescription dose. This exceeds our goal of greater than 90%. Rectal Sparing - The volume of rectal tissue receiving the prescription dose or higher was 0.0 cc. This falls under our thresholds tolerance of 1.0 cc.  Impression: The prostate seed implant appears to show adequate target coverage and appropriate rectal sparing.  Plan:  The patient will continue to follow with urology for ongoing PSA determinations. I would anticipate a high likelihood for local tumor control with minimal risk for rectal morbidity.  ________________________________  Sheral Apley Tammi Klippel, M.D.

## 2019-03-02 MED FILL — OLMESARTAN MEDOXOMIL 40 MG: 40 | 30 days supply | Qty: 30 | Fill #4

## 2019-04-01 MED FILL — OLMESARTAN MEDOXOMIL 40 MG: 40 | 30 days supply | Qty: 30 | Fill #5

## 2019-04-01 MED FILL — SILODOSIN 8 MG CAPS: 8 | 30 days supply | Qty: 30 | Fill #2

## 2019-05-09 MED FILL — OLMESARTAN MEDOXOMIL 40 MG: 40 | 60 days supply | Qty: 60 | Fill #0

## 2019-06-01 MED FILL — SILODOSIN 8 MG CAPS: 8 | 30 days supply | Qty: 30 | Fill #3

## 2019-06-08 DIAGNOSIS — N528 Other male erectile dysfunction: Secondary | ICD-10-CM | POA: Diagnosis not present

## 2019-06-08 DIAGNOSIS — Z1389 Encounter for screening for other disorder: Secondary | ICD-10-CM | POA: Diagnosis not present

## 2019-06-08 DIAGNOSIS — I10 Essential (primary) hypertension: Secondary | ICD-10-CM | POA: Diagnosis not present

## 2019-06-08 DIAGNOSIS — Z6830 Body mass index (BMI) 30.0-30.9, adult: Secondary | ICD-10-CM | POA: Diagnosis not present

## 2019-06-08 DIAGNOSIS — E6609 Other obesity due to excess calories: Secondary | ICD-10-CM | POA: Diagnosis not present

## 2019-06-08 MED FILL — DILT-XR 240 MG CAP SA: 240 | 90 days supply | Qty: 90 | Fill #0

## 2019-07-18 MED FILL — SILODOSIN 8 MG CAPS: 8 | 30 days supply | Qty: 30 | Fill #4

## 2019-07-18 MED FILL — OLMESARTAN MEDOXOMIL 40 MG: 40 | 90 days supply | Qty: 90 | Fill #0

## 2019-09-26 ENCOUNTER — Encounter: Payer: Self-pay | Admitting: Urology

## 2019-09-26 ENCOUNTER — Ambulatory Visit (INDEPENDENT_AMBULATORY_CARE_PROVIDER_SITE_OTHER): Payer: 59 | Admitting: Urology

## 2019-09-26 ENCOUNTER — Other Ambulatory Visit: Payer: Self-pay | Admitting: Urology

## 2019-09-26 ENCOUNTER — Other Ambulatory Visit: Payer: Self-pay

## 2019-09-26 VITALS — BP 129/87 | HR 75 | Temp 98.0°F | Ht 65.0 in | Wt 169.0 lb

## 2019-09-26 DIAGNOSIS — C61 Malignant neoplasm of prostate: Secondary | ICD-10-CM | POA: Diagnosis not present

## 2019-09-26 DIAGNOSIS — R300 Dysuria: Secondary | ICD-10-CM | POA: Diagnosis not present

## 2019-09-26 MED ORDER — SILODOSIN 8 MG PO CAPS: 8.0000 mg | ORAL_CAPSULE | Freq: Every day | ORAL | 5 refills | Status: DC

## 2019-09-26 NOTE — Progress Notes (Signed)
Urological Symptom Review  Patient is experiencing the following symptoms: None   Review of Systems  Gastrointestinal (upper)  : Negative for upper GI symptoms  Gastrointestinal (lower) : Negative for lower GI symptoms  Constitutional : Weight loss  Skin: Negative for skin symptoms  Eyes: Negative for eye symptoms  Ear/Nose/Throat : Negative for Ear/Nose/Throat symptoms  Hematologic/Lymphatic: Negative for Hematologic/Lymphatic symptoms  Cardiovascular : Negative for cardiovascular symptoms  Respiratory : Negative for respiratory symptoms  Endocrine: Negative for endocrine symptoms  Musculoskeletal: Negative for musculoskeletal symptoms  Neurological: Negative for neurological symptoms  Psychologic: Negative for psychiatric symptoms 

## 2019-09-26 NOTE — Progress Notes (Signed)
09/26/2019 10:48 AM   Juan Ponce 1951-02-05 [id number]  Referring provider: Sharilyn Sites, MD [address] [place],  [place] [address]  Dysuria and prostate cancer  HPI: [name] is a 69yo here for followup for prostate cancer.  No recent PSA. He underwent brachytherapy in 03/2019. He has a good stream. He has intermittent dysuria. He is currently on rapaflo. No hematuria   PMH: Past Medical History:  Diagnosis Date  . Hypertension   . Prostate cancer [facility])     Surgical History: Past Surgical History:  Procedure Laterality Date  . CYSTOSCOPY  12/23/2018   Procedure: CYSTOSCOPY FLEXIBLE;  Surgeon: Cleon Gustin, MD;  Location: [facility];  Service: Urology;;  no seeds found in bladder  . NO PAST SURGERIES    . PROSTATE BIOPSY    . RADIOACTIVE SEED IMPLANT N/A 12/23/2018   Procedure: RADIOACTIVE SEED IMPLANT/BRACHYTHERAPY IMPLANT;  Surgeon: Cleon Gustin, MD;  Location: [facility];  Service: Urology;  Laterality: N/A;    56   seeds implanted  . SPACE OAR INSTILLATION N/A 12/23/2018   Procedure: SPACE OAR INSTILLATION;  Surgeon: Cleon Gustin, MD;  Location: [facility];  Service: Urology;  Laterality: N/A;    Home Medications:  Allergies as of 09/26/2019   No Known Allergies     Medication List       Accurate as of September 26, 2019 10:48 AM. If you have any questions, ask your nurse or doctor.        Dilt-XR 240 MG 24 hr capsule Generic drug: diltiazem   olmesartan 40 MG tablet Commonly known as: BENICAR   silodosin 8 MG Caps capsule Commonly known as: RAPAFLO Take 1 capsule (8 mg total) by mouth daily after supper.   Telmisartan-amLODIPine 40-10 MG Tabs   traMADol 50 MG tablet Commonly known as: Ultram Take 1 tablet (50 mg total) by mouth every 6 (six) hours as needed.       Allergies: No Known Allergies  Family History: Family History  Problem Relation Age of Onset  .  Prostate cancer Brother   . Breast cancer Sister   . Lung cancer Brother   . Colon cancer Neg Hx   . Pancreatic cancer Neg Hx     Social History:  reports that he has never smoked. He has never used smokeless tobacco. He reports current alcohol use. He reports that he does not use drugs.  ROS: All other review of systems were reviewed and are negative except what is noted above in HPI  Physical Exam: BP 129/87   Pulse 75   Temp 98 F (36.7 C)   Ht 5\' 5"  (1.651 m)   Wt 169 lb (76.7 kg)   BMI 28.12 kg/m   Constitutional:  Alert and oriented, No acute distress. HEENT: [place] AT, moist mucus membranes.  Trachea midline, no masses. Cardiovascular: No clubbing, cyanosis, or edema. Respiratory: Normal respiratory effort, no increased work of breathing. GI: Abdomen is soft, nontender, nondistended, no abdominal masses GU: No CVA tenderness.  Lymph: No cervical or inguinal lymphadenopathy. Skin: No rashes, bruises or suspicious lesions. Neurologic: Grossly intact, no focal deficits, moving all 4 extremities. Psychiatric: Normal mood and affect.  Laboratory Data: Lab Results  Component Value Date   WBC 7.0 12/20/2018   HGB 13.7 12/20/2018   HCT 41.2 12/20/2018   MCV 97.4 12/20/2018   PLT 246 12/20/2018    Lab Results  Component Value Date   CREATININE 0.87  12/20/2018    No results found for: PSA  No results found for: TESTOSTERONE  No results found for: HGBA1C  Urinalysis No results found for: COLORURINE, APPEARANCEUR, LABSPEC, PHURINE, GLUCOSEU, HGBUR, BILIRUBINUR, KETONESUR, PROTEINUR, UROBILINOGEN, NITRITE, LEUKOCYTESUR  No results found for: LABMICR, [place], RBCUA, LABEPIT, MUCUS, BACTERIA  Pertinent Imaging:  No results found for this or any previous visit. No results found for this or any previous visit. No results found for this or any previous visit. No results found for this or any previous visit. No results found for this or any previous visit. No results  found for this or any previous visit. No results found for this or any previous visit. No results found for this or any previous visit.  Assessment & Plan:    1. Prostate cancer ([place]) -PSa today, will call with results -RCT 3 months with PSA  2. Dysuria -Likely related to brachytherapy. We will continue observation  Return in about 3 months (around 12/26/2019), or PSA today, for PSA.  Nicolette Bang, MD  [facility] [place]

## 2019-09-26 NOTE — Patient Instructions (Signed)
Prostate Cancer  The prostate is a male gland that helps make semen. Prostate cancer is when abnormal cells grow in this gland. Follow these instructions at home:  Take over-the-counter and prescription medicines only as told by your doctor.  Eat a healthy diet.  Get plenty of sleep.  Ask your doctor for help to find a support group for men with prostate cancer.  Keep all follow-up visits as told by your doctor. This is important.  If you have to go to the hospital, let your cancer doctor (oncologist) know.  Touch, hold, hug, and caress your partner to continue to show sexual feelings. Contact a doctor if:  You have trouble peeing (urinating).  You have blood in your pee (urine).  You have pain in your hips, back, or chest. Get help right away if:  You have weakness in your legs.  You lose feeling (have numbness) in your legs.  You cannot control your pee or your poop (stool).  You have trouble breathing.  You have sudden pain in your chest.  You have chills or a fever. Summary  The prostate is a male gland that helps make semen. Prostate cancer is when abnormal cells grow in this gland.  Ask your doctor for help to find a support group for men with prostate cancer.  Contact a doctor if you have problems peeing or have any new pain that you did not have before. This information is not intended to replace advice given to you by your health care provider. Make sure you discuss any questions you have with your health care provider. Document Revised: 05/01/2017 Document Reviewed: 01/28/2016 Elsevier Patient Education  2020 Elsevier Inc.  

## 2019-09-27 LAB — PSA: PSA: 0.6 ng/mL (ref <=4.0)

## 2019-09-27 MED FILL — SILODOSIN 8 MG CAPS: 8 | 30 days supply | Qty: 30 | Fill #0

## 2019-10-20 NOTE — Progress Notes (Signed)
Results mailed 

## 2019-10-26 ENCOUNTER — Telehealth: Payer: Self-pay | Admitting: Urology

## 2019-10-26 NOTE — Telephone Encounter (Signed)
Pt left vm and stated he had questions concerning the lab results he received in the mail.

## 2019-10-26 NOTE — Telephone Encounter (Signed)
Returned pt call concerning lab work, all questions addressed and pt voiced understanding.

## 2019-11-03 MED FILL — DILT-XR 240 MG CAP SA: 240 | 90 days supply | Qty: 90 | Fill #1

## 2019-11-03 MED FILL — OLMESARTAN MEDOXOMIL 40 MG: 40 | 90 days supply | Qty: 90 | Fill #1

## 2019-11-16 ENCOUNTER — Telehealth: Payer: Self-pay

## 2019-11-16 ENCOUNTER — Other Ambulatory Visit: Payer: Self-pay

## 2019-11-16 DIAGNOSIS — C61 Malignant neoplasm of prostate: Secondary | ICD-10-CM

## 2019-11-16 DIAGNOSIS — R300 Dysuria: Secondary | ICD-10-CM

## 2019-11-16 MED ORDER — ALFUZOSIN HCL ER 10 MG PO TB24
10.0000 mg | ORAL_TABLET | Freq: Every day | ORAL | 11 refills | Status: DC
Start: 2019-11-16 — End: 2019-12-26

## 2019-11-16 MED FILL — ALFUZOSIN HCL ER 10 MG TB24: 10 | 30 days supply | Qty: 30 | Fill #0

## 2019-11-16 NOTE — Telephone Encounter (Signed)
Alfuzosin 10mg  daily please

## 2019-11-16 NOTE — Telephone Encounter (Signed)
Received prescription request from pharmacy regarding silodosin- insurance is requesting a trial of finasteride, alfuzosin or tamsulosin first.

## 2019-11-16 NOTE — Telephone Encounter (Signed)
Rx sent in

## 2019-12-19 MED FILL — ALFUZOSIN HCL ER 10 MG TB24: 10 | 30 days supply | Qty: 30 | Fill #1

## 2019-12-26 ENCOUNTER — Encounter: Payer: Self-pay | Admitting: Urology

## 2019-12-26 ENCOUNTER — Other Ambulatory Visit: Payer: Self-pay

## 2019-12-26 ENCOUNTER — Ambulatory Visit (INDEPENDENT_AMBULATORY_CARE_PROVIDER_SITE_OTHER): Payer: 59 | Admitting: Urology

## 2019-12-26 VITALS — BP 135/86 | HR 80 | Temp 98.5°F | Ht 65.0 in | Wt 166.0 lb

## 2019-12-26 DIAGNOSIS — N401 Enlarged prostate with lower urinary tract symptoms: Secondary | ICD-10-CM

## 2019-12-26 DIAGNOSIS — R300 Dysuria: Secondary | ICD-10-CM

## 2019-12-26 DIAGNOSIS — N5201 Erectile dysfunction due to arterial insufficiency: Secondary | ICD-10-CM | POA: Insufficient documentation

## 2019-12-26 DIAGNOSIS — R351 Nocturia: Secondary | ICD-10-CM | POA: Diagnosis not present

## 2019-12-26 DIAGNOSIS — N138 Other obstructive and reflux uropathy: Secondary | ICD-10-CM

## 2019-12-26 DIAGNOSIS — C61 Malignant neoplasm of prostate: Secondary | ICD-10-CM | POA: Diagnosis not present

## 2019-12-26 MED ORDER — ALFUZOSIN HCL ER 10 MG PO TB24: 10.0000 mg | ORAL_TABLET | Freq: Every day | ORAL | 3 refills | Status: DC

## 2019-12-26 MED ORDER — TADALAFIL 20 MG PO TABS
20.0000 mg | ORAL_TABLET | Freq: Every day | ORAL | 3 refills | Status: DC | PRN
Start: 2019-12-26 — End: 2020-06-27

## 2019-12-26 MED FILL — TADALAFIL 20 MG TABS: 20 | 30 days supply | Qty: 6 | Fill #0

## 2019-12-26 NOTE — Progress Notes (Signed)
Urological Symptom Review  Patient is experiencing the following symptoms: None   Review of Systems  Gastrointestinal (upper)  : Negative for upper GI symptoms  Gastrointestinal (lower) : Negative for lower GI symptoms  Constitutional : Weight loss  Skin: Negative for skin symptoms  Eyes: Negative for eye symptoms  Ear/Nose/Throat : Negative for Ear/Nose/Throat symptoms  Hematologic/Lymphatic: Negative for Hematologic/Lymphatic symptoms  Cardiovascular : Negative for cardiovascular symptoms  Respiratory : Negative for respiratory symptoms  Endocrine: Negative for endocrine symptoms  Musculoskeletal: Negative for musculoskeletal symptoms  Neurological: Negative for neurological symptoms  Psychologic: Negative for psychiatric symptoms

## 2019-12-26 NOTE — Addendum Note (Signed)
Addended by: Pollyann Kennedy F on: 12/26/2019 11:52 AM   Modules accepted: Orders

## 2019-12-26 NOTE — Patient Instructions (Signed)
Prostate Cancer  The prostate is a male gland that helps make semen. Prostate cancer is when abnormal cells grow in this gland. Follow these instructions at home:  Take over-the-counter and prescription medicines only as told by your doctor.  Eat a healthy diet.  Get plenty of sleep.  Ask your doctor for help to find a support group for men with prostate cancer.  Keep all follow-up visits as told by your doctor. This is important.  If you have to go to the hospital, let your cancer doctor (oncologist) know.  Touch, hold, hug, and caress your partner to continue to show sexual feelings. Contact a doctor if:  You have trouble peeing (urinating).  You have blood in your pee (urine).  You have pain in your hips, back, or chest. Get help right away if:  You have weakness in your legs.  You lose feeling (have numbness) in your legs.  You cannot control your pee or your poop (stool).  You have trouble breathing.  You have sudden pain in your chest.  You have chills or a fever. Summary  The prostate is a male gland that helps make semen. Prostate cancer is when abnormal cells grow in this gland.  Ask your doctor for help to find a support group for men with prostate cancer.  Contact a doctor if you have problems peeing or have any new pain that you did not have before. This information is not intended to replace advice given to you by your health care provider. Make sure you discuss any questions you have with your health care provider. Document Revised: 05/01/2017 Document Reviewed: 01/28/2016 Elsevier Patient Education  2020 Elsevier Inc.  

## 2019-12-26 NOTE — Addendum Note (Signed)
Addended by: Wyline Mood on: 12/26/2019 04:37 PM   Modules accepted: Orders

## 2019-12-26 NOTE — Progress Notes (Signed)
12/26/2019 11:00 AM   Juan Ponce Dec 13, 1950 [id number]  Referring provider: Sharilyn Sites, MD [address] [place],  [place] [address]  Prostate cancer  HPI: Mr Hennes is a [phone] here for followup for prostate cancer. Last PSA was 0.6 in 09/2019. He has mild LUTS on uroxatral 10mg  qhs. NOcturia 0-3x depending on fluid management.  No urgency, no frequency. No dysuria. No diarrhea. STream strong. He has issues maintaining an erection for over 1 year. He has not tried any PDE5s. NO issues getting an erection.    PMH: Past Medical History:  Diagnosis Date   Hypertension    Prostate cancer [facility])     Surgical History: Past Surgical History:  Procedure Laterality Date   CYSTOSCOPY  12/23/2018   Procedure: CYSTOSCOPY FLEXIBLE;  Surgeon: Cleon Gustin, MD;  Location: [facility];  Service: Urology;;  no seeds found in bladder   NO PAST SURGERIES     PROSTATE BIOPSY     RADIOACTIVE SEED IMPLANT N/A 12/23/2018   Procedure: RADIOACTIVE SEED IMPLANT/BRACHYTHERAPY IMPLANT;  Surgeon: Cleon Gustin, MD;  Location: [facility];  Service: Urology;  Laterality: N/A;    56   seeds implanted   SPACE OAR INSTILLATION N/A 12/23/2018   Procedure: SPACE OAR INSTILLATION;  Surgeon: Cleon Gustin, MD;  Location[facility];  Service: Urology;  Laterality: N/A;    Home Medications:  Allergies as of 12/26/2019   No Known Allergies     Medication List       Accurate as of December 26, 2019 11:00 AM. If you have any questions, ask your nurse or doctor.        alfuzosin 10 MG 24 hr tablet Commonly known as: UROXATRAL Take 1 tablet (10 mg total) by mouth daily with breakfast.   Dilt-XR 240 MG 24 hr capsule Generic drug: diltiazem   olmesartan 40 MG tablet Commonly known as: BENICAR   Telmisartan-amLODIPine 40-10 MG Tabs   traMADol 50 MG tablet Commonly known as: ULTRAM Take by mouth every 6 (six) hours as  needed.       Allergies: No Known Allergies  Family History: Family History  Problem Relation Age of Onset   Prostate cancer Brother    Breast cancer Sister    Lung cancer Brother    Colon cancer Neg Hx    Pancreatic cancer Neg Hx     Social History:  reports that he has never smoked. He has never used smokeless tobacco. He reports current alcohol use. He reports that he does not use drugs.  ROS: All other review of systems were reviewed and are negative except what is noted above in HPI  Physical Exam: BP (!) 135/86    Pulse 80    Temp 98.5 F (36.9 C)    Ht 5\' 5"  (1.651 m)    Wt 166 lb (75.3 kg)    BMI 27.62 kg/m   Constitutional:  Alert and oriented, No acute distress. HEENT: [place] AT, moist mucus membranes.  Trachea midline, no masses. Cardiovascular: No clubbing, cyanosis, or edema. Respiratory: Normal respiratory effort, no increased work of breathing. GI: Abdomen is soft, nontender, nondistended, no abdominal masses GU: No CVA tenderness.  Lymph: No cervical or inguinal lymphadenopathy. Skin: No rashes, bruises or suspicious lesions. Neurologic: Grossly intact, no focal deficits, moving all 4 extremities. Psychiatric: Normal mood and affect.  Laboratory Data: Lab Results  Component Value Date   WBC 7.0 12/20/2018   HGB 13.7 12/20/2018  HCT 41.2 12/20/2018   MCV 97.4 12/20/2018   PLT 246 12/20/2018    Lab Results  Component Value Date   CREATININE 0.87 12/20/2018    Lab Results  Component Value Date   PSA 0.6 09/26/2019    No results found for: TESTOSTERONE  No results found for: HGBA1C  Urinalysis No results found for: COLORURINE, APPEARANCEUR, LABSPEC, PHURINE, GLUCOSEU, HGBUR, BILIRUBINUR, KETONESUR, PROTEINUR, UROBILINOGEN, NITRITE, LEUKOCYTESUR  No results found for: LABMICR, WBCUA, RBCUA, LABEPIT, MUCUS, BACTERIA  Pertinent Imaging:  No results found for this or any previous visit.  No results found for this or any previous  visit.  No results found for this or any previous visit.  No results found for this or any previous visit.  No results found for this or any previous visit.  No results found for this or any previous visit.  No results found for this or any previous visit.  No results found for this or any previous visit.   Assessment & Plan:    1. Prostate cancer ([place]) -PSA today, will call with results. RTC 6 months with PSA  2. BPH with LUTS, nocturia: -uroxatral 10mg  qhs  3. Erectile dysfunction  -tadalafil 20mg    No follow-ups on file.  Nicolette Bang, MD  [facility] [place]

## 2019-12-27 LAB — PSA: Prostate Specific Ag, Serum: 0.8 ng/mL (ref 0.0–4.0)

## 2019-12-27 NOTE — Progress Notes (Signed)
Labs mailed

## 2020-01-19 MED FILL — ALFUZOSIN HCL ER 10 MG TB24: 10 | 30 days supply | Qty: 30 | Fill #2

## 2020-02-21 ENCOUNTER — Other Ambulatory Visit (HOSPITAL_COMMUNITY): Payer: Self-pay | Admitting: Physician Assistant

## 2020-02-21 MED FILL — OLMESARTAN MEDOXOMIL 40 MG: 40 | 90 days supply | Qty: 90 | Fill #0

## 2020-02-21 MED FILL — ALFUZOSIN HCL ER 10 MG TB24: 10 | 30 days supply | Qty: 30 | Fill #3

## 2020-03-21 ENCOUNTER — Other Ambulatory Visit (HOSPITAL_COMMUNITY): Payer: Self-pay | Admitting: Family Medicine

## 2020-03-21 DIAGNOSIS — Z6827 Body mass index (BMI) 27.0-27.9, adult: Secondary | ICD-10-CM | POA: Diagnosis not present

## 2020-03-21 DIAGNOSIS — R1319 Other dysphagia: Secondary | ICD-10-CM | POA: Diagnosis not present

## 2020-03-21 DIAGNOSIS — R945 Abnormal results of liver function studies: Secondary | ICD-10-CM | POA: Diagnosis not present

## 2020-03-21 DIAGNOSIS — K219 Gastro-esophageal reflux disease without esophagitis: Secondary | ICD-10-CM | POA: Diagnosis not present

## 2020-03-21 DIAGNOSIS — E7849 Other hyperlipidemia: Secondary | ICD-10-CM | POA: Diagnosis not present

## 2020-03-21 MED FILL — PANTOPRAZOLE SOD DR 40 MG T: 40 | 90 days supply | Qty: 90 | Fill #0

## 2020-03-22 ENCOUNTER — Other Ambulatory Visit (HOSPITAL_COMMUNITY): Payer: Self-pay | Admitting: Physician Assistant

## 2020-03-22 MED FILL — DILT-XR 240 MG CAP SA: 240 | 90 days supply | Qty: 90 | Fill #0

## 2020-04-05 MED FILL — ALFUZOSIN HCL ER 10 MG TB24: 10 | 30 days supply | Qty: 30 | Fill #4

## 2020-05-03 MED FILL — ALFUZOSIN HCL ER 10 MG TB24: 10 | 30 days supply | Qty: 30 | Fill #5

## 2020-05-16 MED FILL — OLMESARTAN MEDOXOMIL 40 MG: 40 | 90 days supply | Qty: 90 | Fill #1

## 2020-06-18 ENCOUNTER — Other Ambulatory Visit: Payer: 59

## 2020-06-20 ENCOUNTER — Other Ambulatory Visit: Payer: 59

## 2020-06-20 ENCOUNTER — Other Ambulatory Visit: Payer: Self-pay

## 2020-06-20 DIAGNOSIS — C61 Malignant neoplasm of prostate: Secondary | ICD-10-CM | POA: Diagnosis not present

## 2020-06-20 MED FILL — PANTOPRAZOLE SOD DR 40 MG T: 40 | 90 days supply | Qty: 90 | Fill #1

## 2020-06-21 LAB — PSA: Prostate Specific Ag, Serum: 1.7 ng/mL (ref 0.0–4.0)

## 2020-06-27 ENCOUNTER — Ambulatory Visit: Payer: 59 | Admitting: Urology

## 2020-06-27 ENCOUNTER — Other Ambulatory Visit: Payer: Self-pay | Admitting: Urology

## 2020-06-27 ENCOUNTER — Other Ambulatory Visit: Payer: Self-pay

## 2020-06-27 ENCOUNTER — Ambulatory Visit (INDEPENDENT_AMBULATORY_CARE_PROVIDER_SITE_OTHER): Payer: Medicare HMO | Admitting: Urology

## 2020-06-27 ENCOUNTER — Encounter: Payer: Self-pay | Admitting: Urology

## 2020-06-27 VITALS — BP 136/84 | HR 103 | Temp 98.5°F | Ht 64.5 in | Wt 160.0 lb

## 2020-06-27 DIAGNOSIS — N401 Enlarged prostate with lower urinary tract symptoms: Secondary | ICD-10-CM | POA: Diagnosis not present

## 2020-06-27 DIAGNOSIS — C61 Malignant neoplasm of prostate: Secondary | ICD-10-CM

## 2020-06-27 DIAGNOSIS — R300 Dysuria: Secondary | ICD-10-CM | POA: Diagnosis not present

## 2020-06-27 DIAGNOSIS — N138 Other obstructive and reflux uropathy: Secondary | ICD-10-CM

## 2020-06-27 DIAGNOSIS — N5201 Erectile dysfunction due to arterial insufficiency: Secondary | ICD-10-CM

## 2020-06-27 DIAGNOSIS — R351 Nocturia: Secondary | ICD-10-CM

## 2020-06-27 LAB — URINALYSIS, ROUTINE W REFLEX MICROSCOPIC
Bilirubin, UA: NEGATIVE
Glucose, UA: NEGATIVE
Leukocytes,UA: NEGATIVE
Nitrite, UA: NEGATIVE
Protein,UA: NEGATIVE
RBC, UA: NEGATIVE
Specific Gravity, UA: 1.02 (ref 1.005–1.030)
Urobilinogen, Ur: 0.2 mg/dL (ref 0.2–1.0)
pH, UA: 6 (ref 5.0–7.5)

## 2020-06-27 MED ORDER — ALFUZOSIN HCL ER 10 MG PO TB24: 10.0000 mg | ORAL_TABLET | Freq: Every day | ORAL | 3 refills | Status: DC

## 2020-06-27 MED ORDER — TADALAFIL 20 MG PO TABS: 20.0000 mg | ORAL_TABLET | Freq: Every day | ORAL | 3 refills | Status: DC | PRN

## 2020-06-27 MED FILL — ALFUZOSIN HCL ER 10 MG TB24: 10 | 90 days supply | Qty: 90 | Fill #0

## 2020-06-27 MED FILL — TADALAFIL 20 MG TABS: 20 | 23 days supply | Qty: 6 | Fill #0

## 2020-06-27 NOTE — Progress Notes (Signed)
06/27/2020 12:03 PM   Juan Ponce 1950-11-19 [id number]  Referring provider: Sharilyn Sites, [address] [place],  [place] [address]  followup prostate and BPH  HPI: Mr Juan Ponce is 53WU here for followup for prostate cancer and BPH. He has stable mild LUTS on uroxatral. Nocturia 1-2x, no hestitancy, stream intermittently weak. No dribbling. Overall he is happy with his urination. PSA 1.7 up for 0.8. No bone pain. No hot flashes. He continue to have issues getting an erection. He has tried sildenafil 100mg  prn which failed to improved his erections. Good libido.    PMH: Past Medical History:  Diagnosis Date  . Hypertension   . Prostate cancer [facility])     Surgical History: Past Surgical History:  Procedure Laterality Date  . CYSTOSCOPY  12/23/2018   Procedure: CYSTOSCOPY FLEXIBLE;  Surgeon: Cleon Gustin, MD;  Location: [facility];  Service: Urology;;  no seeds found in bladder  . NO PAST SURGERIES    . PROSTATE BIOPSY    . RADIOACTIVE SEED IMPLANT N/A 12/23/2018   Procedure: RADIOACTIVE SEED IMPLANT/BRACHYTHERAPY IMPLANT;  Surgeon: Cleon Gustin, MD;  Location: [facility];  Service: Urology;  Laterality: N/A;    56   seeds implanted  . SPACE OAR INSTILLATION N/A 12/23/2018   Procedure: SPACE OAR INSTILLATION;  Surgeon: Cleon Gustin, MD;  Location: [facility];  Service: Urology;  Laterality: N/A;    Home Medications:  Allergies as of 06/27/2020   No Known Allergies     Medication List       Accurate as of June 27, 2020 12:03 PM. If you have any questions, ask your nurse or doctor.        alfuzosin 10 MG 24 hr tablet Commonly known as: UROXATRAL Take 1 tablet (10 mg total) by mouth daily with breakfast.   Dilt-XR 240 MG 24 hr capsule Generic drug: diltiazem   olmesartan 40 MG tablet Commonly known as: BENICAR   pantoprazole 40 MG tablet Commonly known as: PROTONIX   tadalafil 20  MG tablet Commonly known as: CIALIS Take 1 tablet (20 mg total) by mouth daily as needed for erectile dysfunction.   Telmisartan-amLODIPine 40-10 MG Tabs   traMADol 50 MG tablet Commonly known as: ULTRAM Take by mouth every 6 (six) hours as needed.       Allergies: No Known Allergies  Family History: Family History  Problem Relation Age of Onset  . Prostate cancer Brother   . Breast cancer Sister   . Lung cancer Brother   . Colon cancer Neg Hx   . Pancreatic cancer Neg Hx     Social History:  reports that he has never smoked. He has never used smokeless tobacco. He reports current alcohol use. He reports that he does not use drugs.  ROS: All other review of systems were reviewed and are negative except what is noted above in HPI  Physical Exam: BP 136/84   Pulse (!) 103   Temp 98.5 F (36.9 C)   Ht 5' 4.5" (1.638 m)   Wt 160 lb (72.6 kg)   BMI 27.04 kg/m   Constitutional:  Alert and oriented, No acute distress. HEENT: [place] AT, moist mucus membranes.  Trachea midline, no masses. Cardiovascular: No clubbing, cyanosis, or edema. Respiratory: Normal respiratory effort, no increased work of breathing. GI: Abdomen is soft, nontender, nondistended, no abdominal masses GU: No CVA tenderness.  Lymph: No cervical or inguinal lymphadenopathy. Skin: No rashes, bruises or  suspicious lesions. Neurologic: Grossly intact, no focal deficits, moving all 4 extremities. Psychiatric: Normal mood and affect.  Laboratory Data: Lab Results  Component Value Date   WBC 7.0 12/20/2018   HGB 13.7 12/20/2018   HCT 41.2 12/20/2018   MCV 97.4 12/20/2018   PLT 246 12/20/2018    Lab Results  Component Value Date   CREATININE 0.87 12/20/2018    Lab Results  Component Value Date   PSA 0.6 09/26/2019    No results found for: TESTOSTERONE  No results found for: HGBA1C  Urinalysis No results found for: COLORURINE, APPEARANCEUR, LABSPEC, PHURINE, GLUCOSEU, HGBUR, BILIRUBINUR,  KETONESUR, PROTEINUR, UROBILINOGEN, NITRITE, LEUKOCYTESUR  No results found for: LABMICR, [place], RBCUA, LABEPIT, MUCUS, BACTERIA  Pertinent Imaging:  No results found for this or any previous visit.  No results found for this or any previous visit.  No results found for this or any previous visit.  No results found for this or any previous visit.  No results found for this or any previous visit.  No results found for this or any previous visit.  No results found for this or any previous visit.  No results found for this or any previous visit.   Assessment & Plan:    1. Prostate cancer ([place]) -RTC 6 months PSA - Urinalysis, Routine w reflex microscopic - alfuzosin (UROXATRAL) 10 MG 24 hr tablet; Take 1 tablet (10 mg total) by mouth daily with breakfast.  Dispense: 90 tablet; Refill: 3  2. Benign prostatic hyperplasia with urinary obstruction -uroxatral 10mg  qhs  3. Nocturia -Uroxatral 10mg  qhs  4. Erectile dysfunction due to arterial insufficiency -tadalafil 20mg  qhs  5. Dysuria - alfuzosin (UROXATRAL) 10 MG 24 hr tablet; Take 1 tablet (10 mg total) by mouth daily with breakfast.  Dispense: 90 tablet; Refill: 3   Return in about 6 months (around 12/25/2020) for PSA.  Nicolette Bang, MD  [facility] [place]

## 2020-06-27 NOTE — Patient Instructions (Signed)
Prostate Cancer  The prostate is a male gland that helps make semen. It is located below a man's bladder, in front of the rectum. Prostate cancer is when abnormal cells grow in this gland. What are the causes? The cause of this condition is not known. What increases the risk? You are more likely to develop this condition if:  You are 69 years of age or older.  You are African American.  You have a family history of prostate cancer.  You have a family history of breast cancer. What are the signs or symptoms? Symptoms of this condition include:  A need to pee often.  Peeing that is weak, or pee that stops and starts.  Trouble starting or stopping your pee.  Inability to pee.  Blood in your pee or semen.  Pain in the lower back, lower belly (abdomen), hips, or upper thighs.  Trouble getting an erection.  Trouble emptying all of your pee. How is this treated? Treatment for this condition depends on your age, your health, the kind of treatment you like, and how far the cancer has spread. Treatments include:  Being watched. This is called observation. You will be tested from time to time, but you will not get treated. Tests are to make sure that the cancer is not growing.  Surgery. This may be done to remove the prostate, to remove the testicles, or to freeze or kill cancer cells.  Radiation. This uses a strong beam to kill cancer cells.  Ultrasound energy. This uses strong sound waves to kill cancer cells.  Chemotherapy. This uses medicines that stop cancer cells from increasing. This kills cancer cells and healthy cells.  Targeted therapy. This kills cancer cells only. Healthy cells are not affected.  Hormone treatment. This stops the body from making hormones that help the cancer cells to grow. Follow these instructions at home:  Take over-the-counter and prescription medicines only as told by your doctor.  Eat a healthy diet.  Get plenty of sleep.  Ask your  doctor for help to find a support group for men with prostate cancer.  If you have to go to the hospital, let your cancer doctor (oncologist) know.  Treatment may affect your ability to have sex. Touch, hold, hug, and caress your partner to have intimate moments.  Keep all follow-up visits as told by your doctor. This is important. Contact a doctor if:  You have new or more trouble peeing.  You have new or more blood in your pee.  You have new or more pain in your hips, back, or chest. Get help right away if:  You have weakness in your legs.  You lose feeling in your legs.  You cannot control your pee or your poop (stool).  You have chills or a fever. Summary  The prostate is a male gland that helps make semen.  Prostate cancer is when abnormal cells grow in this gland.  Treatment includes doing surgery, using medicines, using very strong beams, or watching without treatment.  Ask your doctor for help to find a support group for men with prostate cancer.  Contact a doctor if you have problems peeing or have any new pain that you did not have before. This information is not intended to replace advice given to you by your health care provider. Make sure you discuss any questions you have with your health care provider. Document Revised: 05/03/2019 Document Reviewed: 05/03/2019 Elsevier Patient Education  2021 Elsevier Inc.  

## 2020-06-27 NOTE — Progress Notes (Signed)
Urological Symptom Review  Patient is experiencing the following symptoms: none   Review of Systems  Gastrointestinal (upper)  : Nausea Indigestion/heartburn  Gastrointestinal (lower) : Negative for lower GI symptoms  Constitutional : Weight loss  Skin: Negative for skin symptoms  Eyes: Negative for eye symptoms  Ear/Nose/Throat : Negative  Hematologic/Lymphatic: Negative for Hematologic/Lymphatic symptoms  Cardiovascular : Negative for cardiovascular symptoms  Respiratory : Negative for respiratory symptoms  Endocrine: Negative for endocrine symptoms  Musculoskeletal: Joint pain  Neurological: Negative for neurological symptoms  Psychologic: Negative for psychiatric symptoms

## 2020-06-28 ENCOUNTER — Ambulatory Visit: Payer: 59 | Admitting: Urology

## 2020-06-28 ENCOUNTER — Telehealth: Payer: Self-pay

## 2020-06-28 NOTE — Telephone Encounter (Signed)
Notified pt PSA normal.

## 2020-06-28 NOTE — Telephone Encounter (Signed)
-----   Message from Cleon Gustin, MD sent at 06/26/2020 11:09 AM EST ----- normal ----- Message ----- From: Valentina Lucks, LPN Sent: 6/78/9381  12:43 PM EST To: Cleon Gustin, MD  Pls review.

## 2020-07-31 MED FILL — TADALAFIL 20 MG TABS: 20 | 23 days supply | Qty: 6 | Fill #1

## 2020-09-01 ENCOUNTER — Other Ambulatory Visit (HOSPITAL_COMMUNITY): Payer: Self-pay

## 2020-09-04 IMAGING — US US GUIDANCE NEEDLE PLACEMENT
1 series · 14 of 20 positions shown · non-contrast
Comparison: None.

CLINICAL DATA: Elevated PSA

EXAM:
IR ULTRASOUND GUIDED ASPIRATION/DRAINAGE

[Series 1: us guidance needle placement · 14 of 20 slices shown]
[im 1/20]
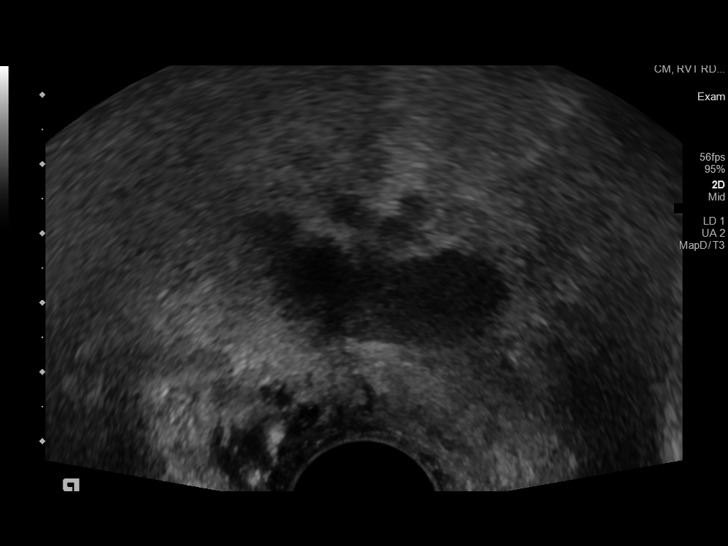
[im 3/20]
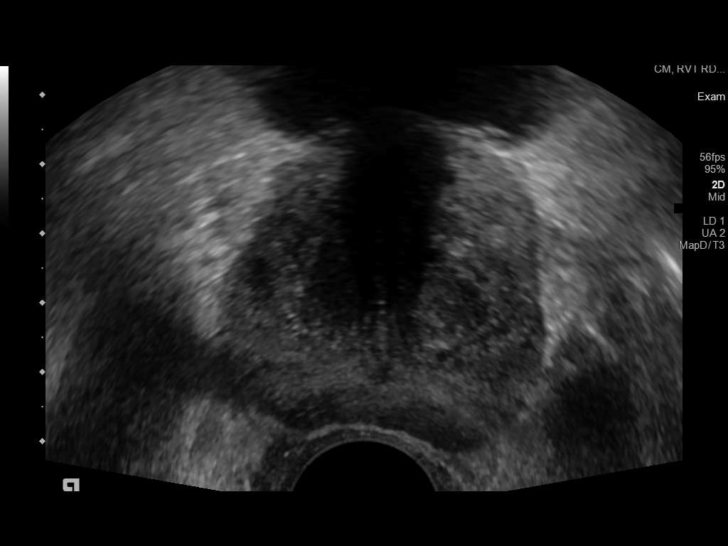
[im 4/20]
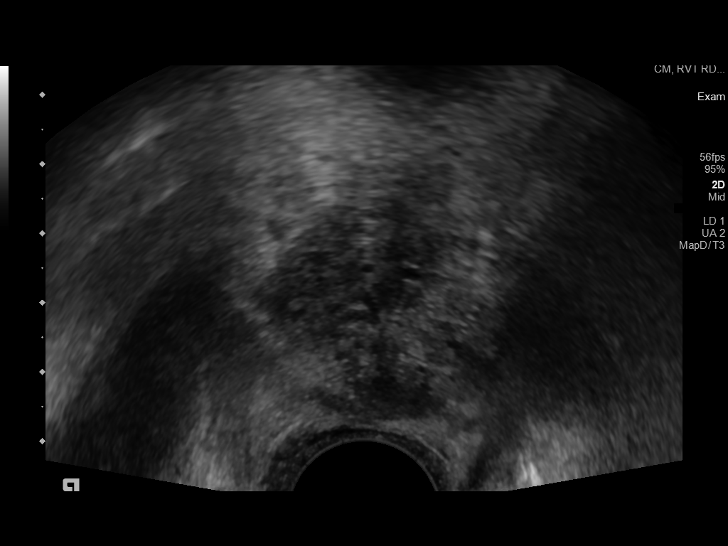
[im 6/20]
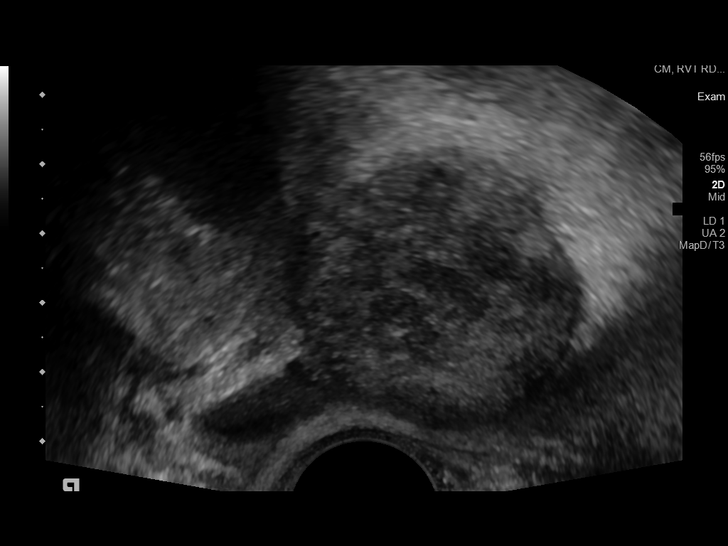
[im 7/20]
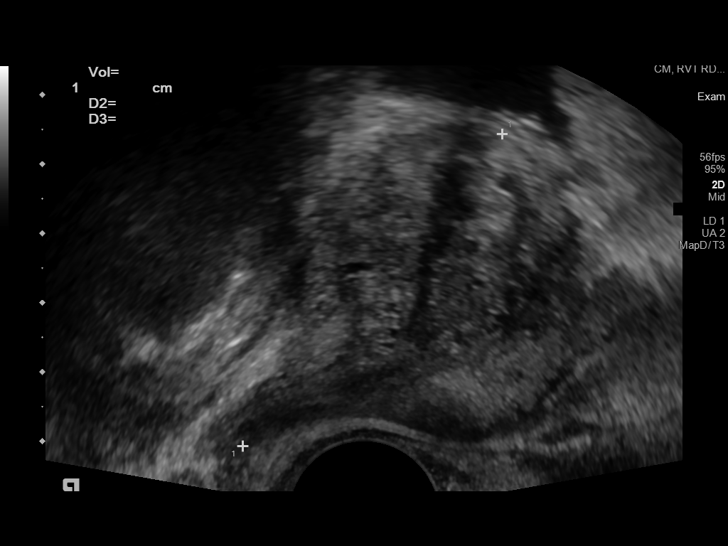
[im 8/20]
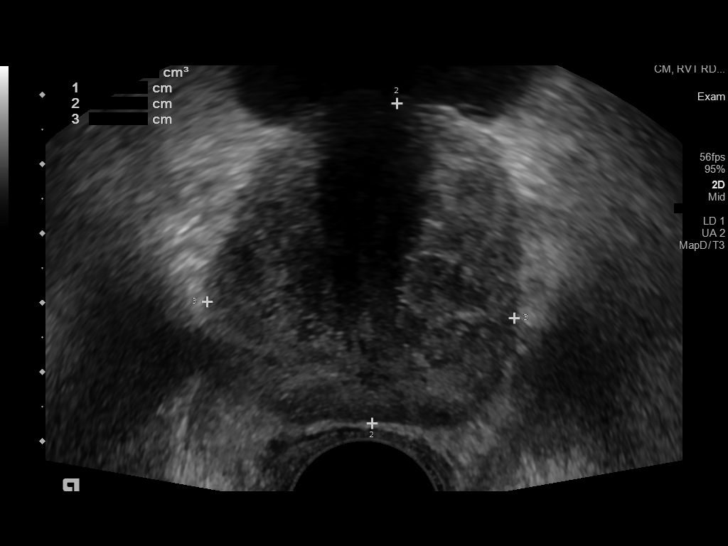
[im 10/20]
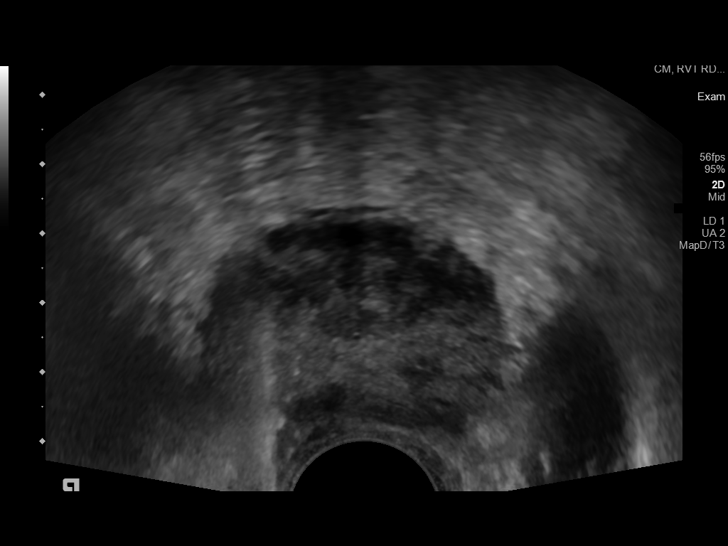
[im 11/20]
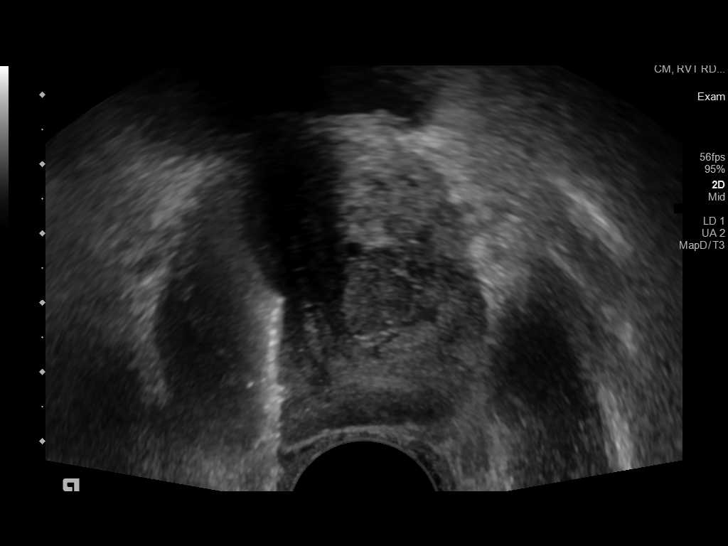
[im 13/20]
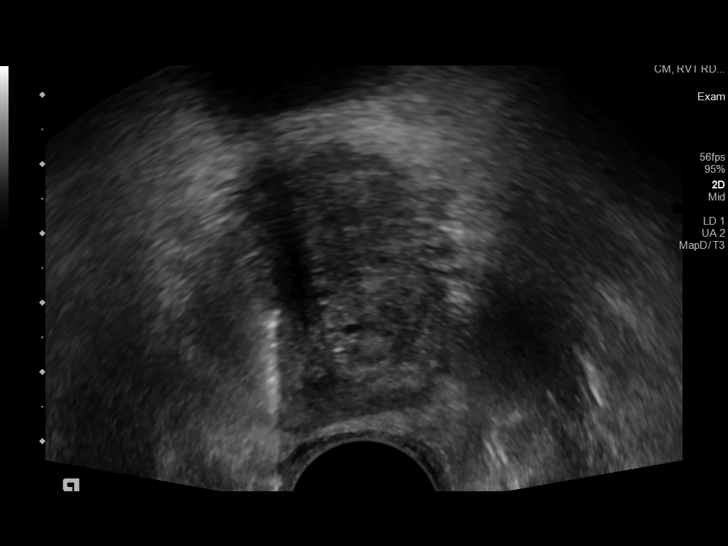
[im 14/20]
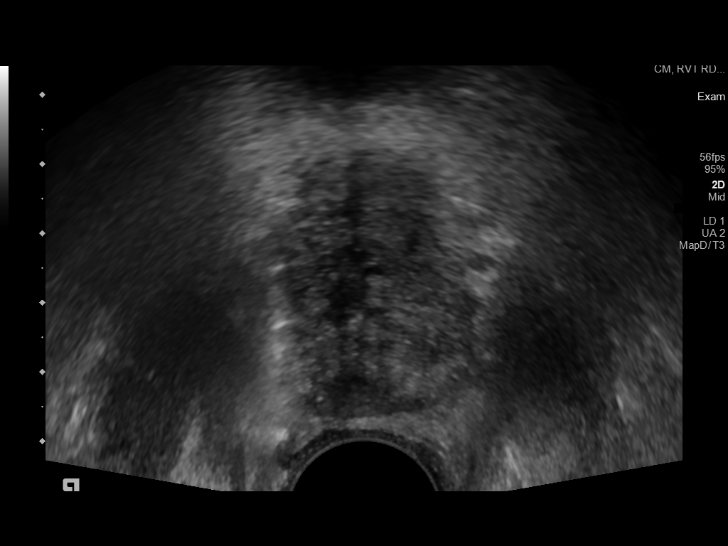
[im 16/20]
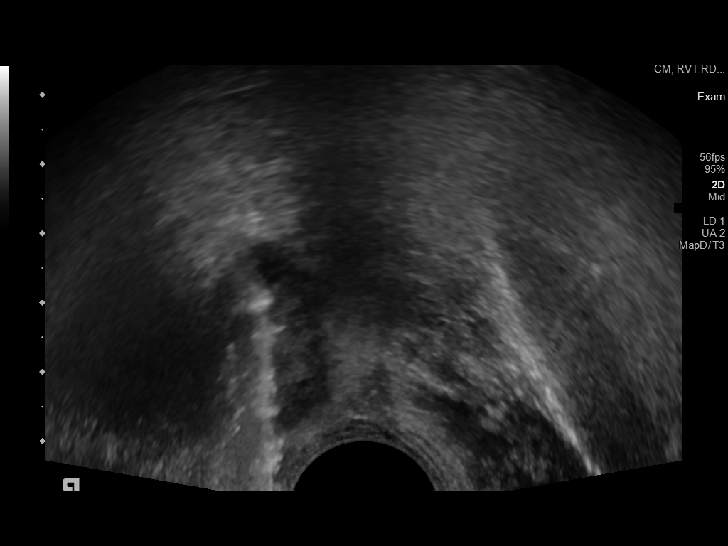
[im 17/20]
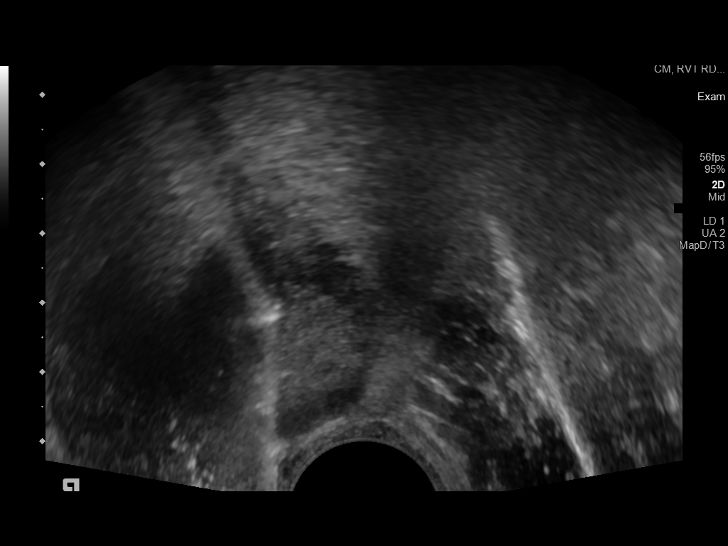
[im 18/20]
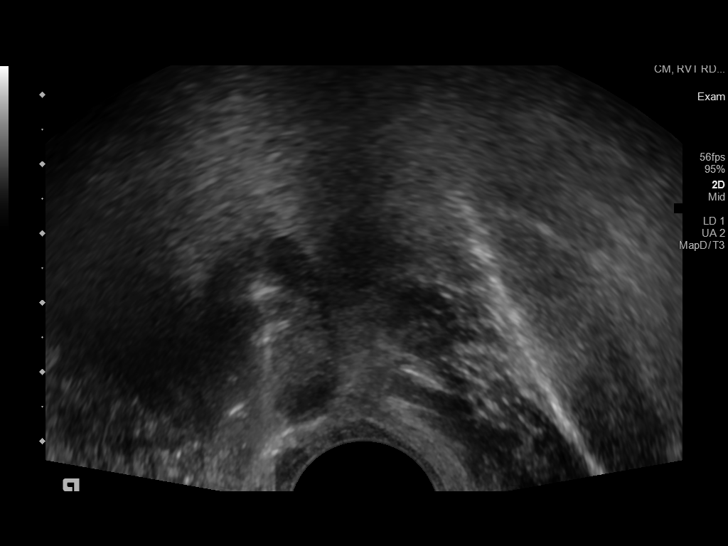
[im 20/20]
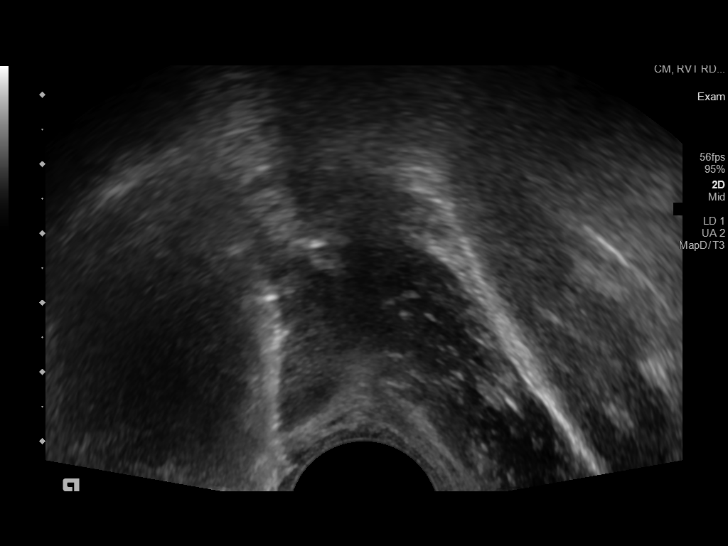

[14 of 20 positions shown; findings below may reference images not displayed]

FINDINGS: Series of images document that ultrasound guidance was provided for
prostate biopsy.
IMPRESSION: Ultrasound guidance for prostate biopsy

## 2020-09-10 ENCOUNTER — Other Ambulatory Visit (HOSPITAL_COMMUNITY): Payer: Self-pay

## 2020-09-10 MED ORDER — PANTOPRAZOLE SODIUM 40 MG PO TBEC
40.0000 mg | DELAYED_RELEASE_TABLET | Freq: Every day | ORAL | 3 refills | Status: AC
  Filled 2020-09-10: qty 90, 90d supply, fill #0
  Filled 2020-12-17 – 2020-12-18 (×2): qty 90, 90d supply, fill #1
  Filled 2021-03-27: qty 90, 90d supply, fill #2

## 2020-09-10 MED FILL — Diltiazem HCl Cap ER 24HR 240 MG: ORAL | 90 days supply | Qty: 90 | Fill #0 | Status: AC

## 2020-09-11 ENCOUNTER — Other Ambulatory Visit (HOSPITAL_COMMUNITY): Payer: Self-pay

## 2020-09-14 ENCOUNTER — Other Ambulatory Visit (HOSPITAL_COMMUNITY): Payer: Self-pay

## 2020-09-17 ENCOUNTER — Other Ambulatory Visit (HOSPITAL_COMMUNITY): Payer: Self-pay

## 2020-09-17 MED ORDER — OLMESARTAN MEDOXOMIL 40 MG PO TABS
40.0000 mg | ORAL_TABLET | Freq: Every morning | ORAL | 1 refills | Status: DC
  Filled 2020-09-17: qty 90, 90d supply, fill #0
  Filled 2020-12-17: qty 90, 90d supply, fill #1

## 2020-09-20 DIAGNOSIS — Z23 Encounter for immunization: Secondary | ICD-10-CM | POA: Diagnosis not present

## 2020-09-20 DIAGNOSIS — Z0001 Encounter for general adult medical examination with abnormal findings: Secondary | ICD-10-CM | POA: Diagnosis not present

## 2020-09-20 DIAGNOSIS — C61 Malignant neoplasm of prostate: Secondary | ICD-10-CM | POA: Diagnosis not present

## 2020-09-20 DIAGNOSIS — Z6826 Body mass index (BMI) 26.0-26.9, adult: Secondary | ICD-10-CM | POA: Diagnosis not present

## 2020-09-20 DIAGNOSIS — I10 Essential (primary) hypertension: Secondary | ICD-10-CM | POA: Diagnosis not present

## 2020-09-20 DIAGNOSIS — E7849 Other hyperlipidemia: Secondary | ICD-10-CM | POA: Diagnosis not present

## 2020-11-05 ENCOUNTER — Other Ambulatory Visit (HOSPITAL_COMMUNITY): Payer: Self-pay

## 2020-11-05 MED FILL — Alfuzosin HCl Tab ER 24HR 10 MG: ORAL | 90 days supply | Qty: 90 | Fill #0 | Status: AC

## 2020-12-14 ENCOUNTER — Other Ambulatory Visit: Payer: Medicare HMO

## 2020-12-14 ENCOUNTER — Other Ambulatory Visit: Payer: Self-pay

## 2020-12-14 DIAGNOSIS — C61 Malignant neoplasm of prostate: Secondary | ICD-10-CM

## 2020-12-15 LAB — PSA: Prostate Specific Ag, Serum: 1 ng/mL (ref 0.0–4.0)

## 2020-12-17 ENCOUNTER — Other Ambulatory Visit (HOSPITAL_COMMUNITY): Payer: Self-pay

## 2020-12-17 MED ORDER — DILTIAZEM HCL ER 240 MG PO CP24
240.0000 mg | ORAL_CAPSULE | Freq: Every evening | ORAL | 1 refills | Status: AC
  Filled 2020-12-17: qty 90, 90d supply, fill #0
  Filled 2021-03-27: qty 90, 90d supply, fill #1

## 2020-12-18 ENCOUNTER — Other Ambulatory Visit: Payer: 59

## 2020-12-18 ENCOUNTER — Other Ambulatory Visit (HOSPITAL_COMMUNITY): Payer: Self-pay

## 2020-12-19 ENCOUNTER — Other Ambulatory Visit: Payer: 59

## 2020-12-21 ENCOUNTER — Ambulatory Visit: Payer: Medicare HMO | Admitting: Urology

## 2020-12-26 ENCOUNTER — Ambulatory Visit: Payer: 59 | Admitting: Urology

## 2021-01-01 ENCOUNTER — Telehealth: Payer: Medicare HMO | Admitting: Urology

## 2021-01-29 NOTE — Progress Notes (Signed)
Results mailed to patient;.

## 2021-02-25 ENCOUNTER — Ambulatory Visit: Payer: Medicare HMO | Admitting: Urology

## 2021-02-26 ENCOUNTER — Other Ambulatory Visit: Payer: Medicare HMO

## 2021-03-05 ENCOUNTER — Encounter: Payer: Self-pay | Admitting: Urology

## 2021-03-05 ENCOUNTER — Other Ambulatory Visit (HOSPITAL_COMMUNITY): Payer: Self-pay

## 2021-03-05 ENCOUNTER — Ambulatory Visit (INDEPENDENT_AMBULATORY_CARE_PROVIDER_SITE_OTHER): Payer: Medicare HMO | Admitting: Urology

## 2021-03-05 ENCOUNTER — Other Ambulatory Visit: Payer: Self-pay

## 2021-03-05 DIAGNOSIS — R351 Nocturia: Secondary | ICD-10-CM

## 2021-03-05 DIAGNOSIS — C61 Malignant neoplasm of prostate: Secondary | ICD-10-CM | POA: Diagnosis not present

## 2021-03-05 DIAGNOSIS — N138 Other obstructive and reflux uropathy: Secondary | ICD-10-CM

## 2021-03-05 DIAGNOSIS — R300 Dysuria: Secondary | ICD-10-CM

## 2021-03-05 DIAGNOSIS — N5201 Erectile dysfunction due to arterial insufficiency: Secondary | ICD-10-CM

## 2021-03-05 DIAGNOSIS — N401 Enlarged prostate with lower urinary tract symptoms: Secondary | ICD-10-CM | POA: Diagnosis not present

## 2021-03-05 MED ORDER — ALFUZOSIN HCL ER 10 MG PO TB24
10.0000 mg | ORAL_TABLET | Freq: Every day | ORAL | 3 refills | Status: DC
  Filled 2021-03-05: qty 90, 90d supply, fill #0
  Filled 2021-06-27: qty 90, 90d supply, fill #1

## 2021-03-05 NOTE — Progress Notes (Signed)
03/05/2021 1:39 PM   Juan Ponce [birth year] [id number]  Referring provider: Sharilyn Sites, [address] [place],  [place] [address]  Patient location: home Physician location: office I connected with  Juan Ponce on 03/05/21 by a video enabled telemedicine application and verified that I am speaking with the correct person using two identifiers.   I discussed the limitations of evaluation and management by telemedicine. The patient expressed understanding and agreed to proceed.    Followup prostate cancer   HPI: Mr Juan Ponce is a 70yo here for followup for prostate cancer, BPh and erectile dysfunction. PSA 1.0. He has stable LUTS on uroxatral 10mg  qhs. Nocturia 1-2x. Urine stream strong. No straining to urinate. No hesitancy. He has stable mild ED which does not require PDE5s. No other complains today   PMH: Past Medical History:  Diagnosis Date   Hypertension    Prostate cancer [facility])     Surgical History: Past Surgical History:  Procedure Laterality Date   CYSTOSCOPY  12/23/2018   Procedure: CYSTOSCOPY FLEXIBLE;  Surgeon: Cleon Gustin, MD;  Location: [facility];  Service: Urology;;  no seeds found in bladder   NO PAST SURGERIES     PROSTATE BIOPSY     RADIOACTIVE SEED IMPLANT N/A 12/23/2018   Procedure: RADIOACTIVE SEED IMPLANT/BRACHYTHERAPY IMPLANT;  Surgeon: Cleon Gustin, MD;  Location: [facility];  Service: Urology;  Laterality: N/A;    56   seeds implanted   SPACE OAR INSTILLATION N/A 12/23/2018   Procedure: SPACE OAR INSTILLATION;  Surgeon: Cleon Gustin, MD;  Location: [facility];  Service: Urology;  Laterality: N/A;    Home Medications:  Allergies as of 03/05/2021   No Known Allergies      Medication List        Accurate as of March 05, 2021  1:39 PM. If you have any questions, ask your nurse or doctor.          alfuzosin 10 MG 24 hr tablet Commonly known as:  UROXATRAL TAKE 1 TABLET (10 MG TOTAL) BY MOUTH DAILY WITH BREAKFAST.   Dilt-XR 240 MG 24 hr capsule Generic drug: diltiazem   Dilt-XR 240 MG 24 hr capsule Generic drug: diltiazem TAKE 1 CAPSULE BY MOUTH ONCE DAILY AT NIGHT   Dilt-XR 240 MG 24 hr capsule Generic drug: diltiazem Take 1 capsule (240 mg total) by mouth at night.   olmesartan 40 MG tablet Commonly known as: BENICAR   olmesartan 40 MG tablet Commonly known as: BENICAR TAKE 1 TABLET BY MOUTH ONCE DAILY IN MORNING   olmesartan 40 MG tablet Commonly known as: BENICAR TAKE 1 TABLET BY MOUTH ONCE DAILY IN MORNING   pantoprazole 40 MG tablet Commonly known as: PROTONIX Take 1 tablet (40 mg total) by mouth daily.   tadalafil 20 MG tablet Commonly known as: CIALIS TAKE 1 TABLET (20 MG TOTAL) BY MOUTH DAILY AS NEEDED FOR ERECTILE DYSFUNCTION.   Telmisartan-amLODIPine 40-10 MG Tabs   traMADol 50 MG tablet Commonly known as: ULTRAM Take by mouth every 6 (six) hours as needed.        Allergies: No Known Allergies  Family History: Family History  Problem Relation Age of Onset   Prostate cancer Brother    Breast cancer Sister    Lung cancer Brother    Colon cancer Neg Hx    Pancreatic cancer Neg Hx     Social History:  reports that he has never smoked. He has never  used smokeless tobacco. He reports current alcohol use. He reports that he does not use drugs.  ROS: All other review of systems were reviewed and are negative except what is noted above in HPI   Laboratory Data: Lab Results  Component Value Date   WBC 7.0 12/20/2018   HGB 13.7 12/20/2018   HCT 41.2 12/20/2018   MCV 97.4 12/20/2018   PLT 246 12/20/2018    Lab Results  Component Value Date   CREATININE 0.87 12/20/2018    Lab Results  Component Value Date   PSA 0.6 09/26/2019    No results found for: TESTOSTERONE  No results found for: HGBA1C  Urinalysis    Component Value Date/Time   APPEARANCEUR Clear 06/27/2020 1315    GLUCOSEU Negative 06/27/2020 1315   BILIRUBINUR Negative 06/27/2020 1315   PROTEINUR Negative 06/27/2020 1315   NITRITE Negative 06/27/2020 1315   LEUKOCYTESUR Negative 06/27/2020 1315    Lab Results  Component Value Date   LABMICR Comment 06/27/2020    Pertinent Imaging:  No results found for this or any previous visit.  No results found for this or any previous visit.  No results found for this or any previous visit.  No results found for this or any previous visit.  No results found for this or any previous visit.  No results found for this or any previous visit.  No results found for this or any previous visit.  No results found for this or any previous visit.   Assessment & Plan:    1. Prostate cancer ([place]) -RTC 6 months with PSA  2. Benign prostatic hyperplasia with urinary obstruction -Continue uroxatral 10mg  qhs  3. Nocturia -continue uroxatral 10mg  qhs  4. Erectile dysfunction due to arterial insufficiency -patient defers treatment at this time   No follow-ups on file.  Nicolette Bang, MD  [facility]rology [place]

## 2021-03-05 NOTE — Patient Instructions (Signed)
Prostate Cancer °The prostate is a small gland that helps make semen. It is located below a man's bladder, in front of the rectum. Prostate cancer is when abnormal cells grow in this gland. °What are the causes? °The cause of this condition is not known. °What increases the risk? °Being age 69 or older. °Having a family history of prostate cancer. °Having a family history of cancer of the breasts or ovaries. °Having genes that are passed from parent to child (inherited). °Having Lynch syndrome. °African American men and men of African descent are diagnosed with prostate cancer at higher rates than other men. °What are the signs or symptoms? °Problems peeing (urinating). This may include: °A stream that is weak, or pee that stops and starts. °Trouble starting or stopping your pee. °Trouble emptying all of your pee. °Needing to pee more often, especially at night. °Blood in your pee or semen. °Pain in the: °Lower back. °Lower belly (abdomen). °Hips. °Trouble getting an erection. °Weakness or numbness in the legs or feet. °How is this treated? °Treatment for this condition depends on: °How much the cancer has spread. °Your age. °The kind of treatment you want. °Your health. °Treatments include: °Being watched. This is called observation. You will be tested from time to time, but you will not get treated. Tests are to make sure that the cancer is not growing. °Surgery. This may be done to: °Take out (remove) the prostate. °Freeze and kill cancer cells. °Radiation. This uses a strong beam of energy to kill cancer cells. °Chemotherapy. This uses medicines that stop cancer cells from increasing. This kills cancer cells and healthy cells. °Targeted therapy. This kills cancer cells only. Healthy cells are not affected. °Hormone treatment. This stops the body from making hormones that help the cancer cells grow. °Follow these instructions at home: °Lifestyle °Do not smoke or use any products that contain nicotine or tobacco.  If you need help quitting, ask your doctor. °Eat a healthy diet. °Treatment may affect your ability to have sex. If you have a partner, touch, hold, hug, and caress your partner to have intimate moments. °Get plenty of sleep. °Ask your doctor for help to find a support group for men with prostate cancer. °General instructions °Take over-the-counter and prescription medicines only as told by your doctor. °If you have to go to the hospital, let your cancer doctor (oncologist) know. °Keep all follow-up visits. °Where to find more information °American Cancer Society: www.cancer.org °American Society of Clinical Oncology: www.cancer.net °National Cancer Institute: www.cancer.gov °Contact a doctor if: °You have new or more trouble peeing. °You have new or more blood in your pee. °You have new or more pain in your hips, back, or chest. °Get help right away if: °You have weakness in your legs. °You lose feeling in your legs. °You cannot control your pee or your poop (stool). °You have chills or a fever. °Summary °The prostate is a male gland that helps make semen. °Prostate cancer is when abnormal cells grow in this gland. °Treatment includes doing surgery, using medicines, using strong beams of energy, or watching without treatment. °Ask your doctor for help to find a support group for men with prostate cancer. °Contact a doctor if you have problems peeing or have any new pain that you did not have before. °This information is not intended to replace advice given to you by your health care provider. Make sure you discuss any questions you have with your health care provider. °Document Revised: 08/15/2020 Document Reviewed: 08/15/2020 °Elsevier   Patient Education © 2022 Elsevier Inc. ° °

## 2021-03-06 ENCOUNTER — Other Ambulatory Visit (HOSPITAL_COMMUNITY): Payer: Self-pay

## 2021-03-13 IMAGING — CR CHEST - 2 VIEW
2 series · 2 of 2 positions shown · non-contrast
Comparison: None.

CLINICAL DATA: Preop prostate surgery.

EXAM:
CHEST - 2 VIEW

[w chest pa]
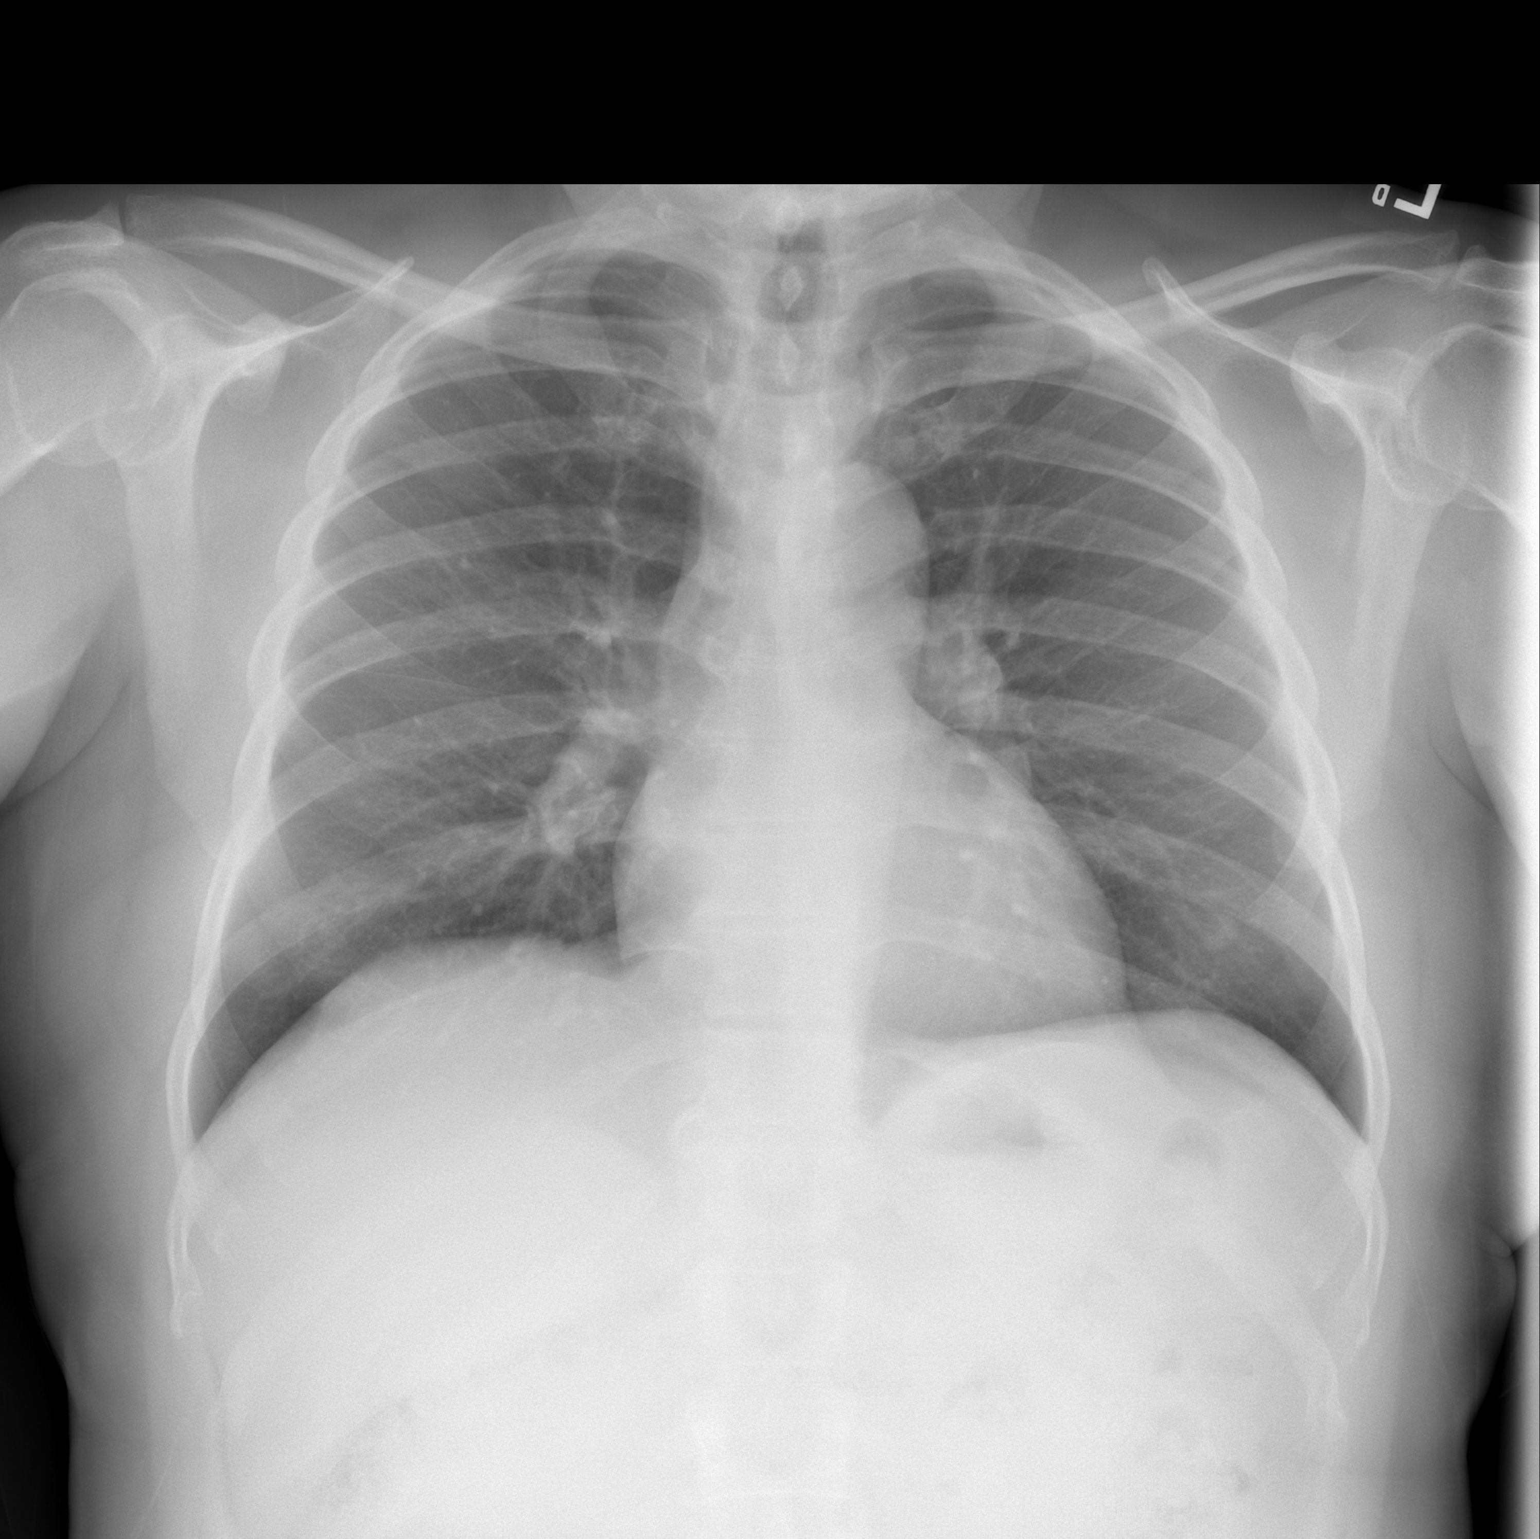

[w chest lat]
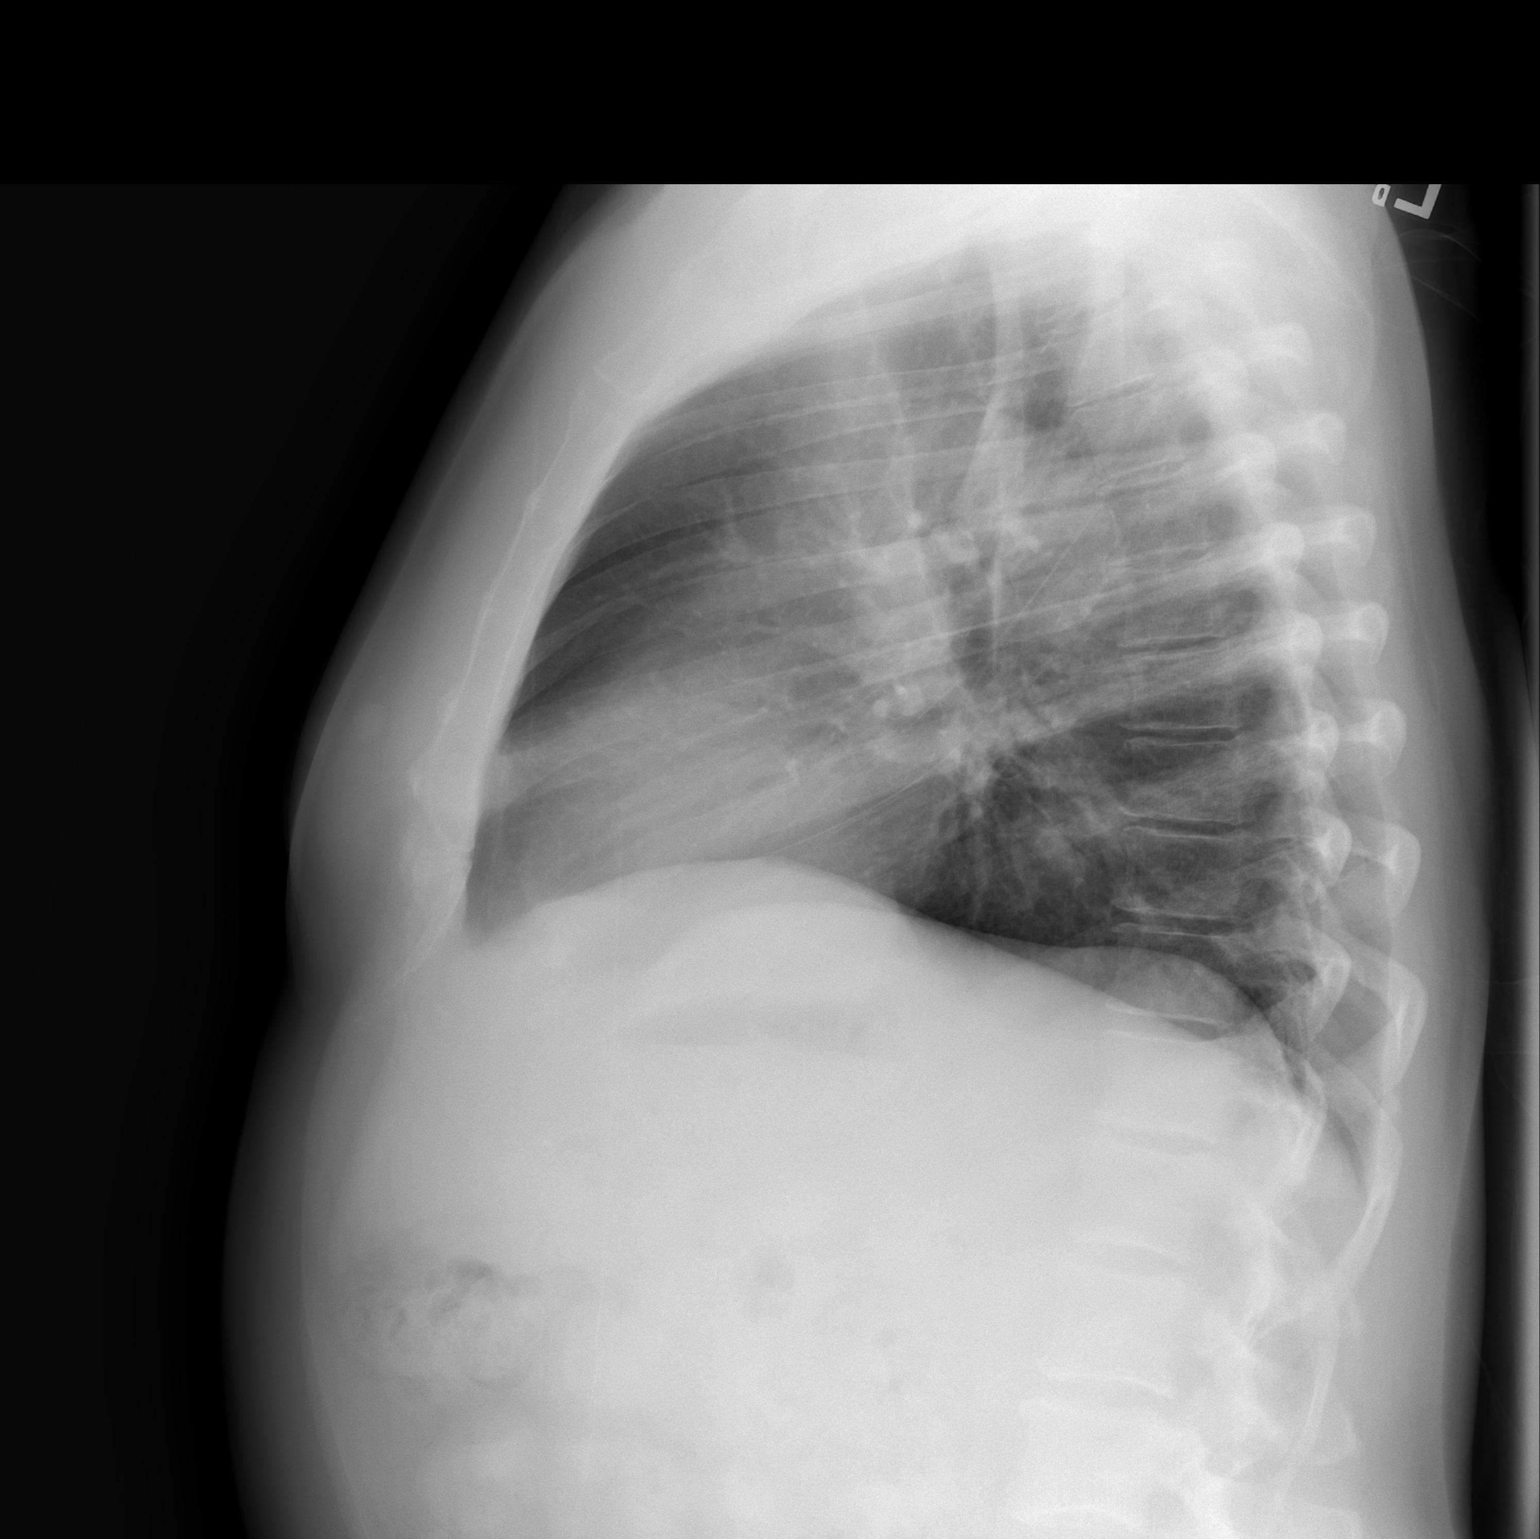

[2 of 2 positions shown; findings below may reference images not displayed]

FINDINGS: The heart size and mediastinal contours are within normal limits.
Both lungs are clear. The visualized skeletal structures are
unremarkable.
IMPRESSION: No active cardiopulmonary disease.

## 2021-03-27 ENCOUNTER — Other Ambulatory Visit (HOSPITAL_COMMUNITY): Payer: Self-pay

## 2021-03-27 MED ORDER — OLMESARTAN MEDOXOMIL 40 MG PO TABS
40.0000 mg | ORAL_TABLET | Freq: Every morning | ORAL | 0 refills | Status: AC
  Filled 2021-03-27: qty 90, 90d supply, fill #0

## 2021-06-26 ENCOUNTER — Other Ambulatory Visit (HOSPITAL_COMMUNITY): Payer: Self-pay

## 2021-06-26 DIAGNOSIS — E663 Overweight: Secondary | ICD-10-CM | POA: Diagnosis not present

## 2021-06-26 DIAGNOSIS — Z6827 Body mass index (BMI) 27.0-27.9, adult: Secondary | ICD-10-CM | POA: Diagnosis not present

## 2021-06-26 DIAGNOSIS — E782 Mixed hyperlipidemia: Secondary | ICD-10-CM | POA: Diagnosis not present

## 2021-06-26 DIAGNOSIS — E7849 Other hyperlipidemia: Secondary | ICD-10-CM | POA: Diagnosis not present

## 2021-06-26 DIAGNOSIS — C61 Malignant neoplasm of prostate: Secondary | ICD-10-CM | POA: Diagnosis not present

## 2021-06-26 DIAGNOSIS — R945 Abnormal results of liver function studies: Secondary | ICD-10-CM | POA: Diagnosis not present

## 2021-06-26 DIAGNOSIS — R7309 Other abnormal glucose: Secondary | ICD-10-CM | POA: Diagnosis not present

## 2021-06-26 DIAGNOSIS — I10 Essential (primary) hypertension: Secondary | ICD-10-CM | POA: Diagnosis not present

## 2021-06-26 MED ORDER — DILTIAZEM HCL ER 240 MG PO CP24
240.0000 mg | ORAL_CAPSULE | Freq: Every evening | ORAL | 2 refills | Status: DC
  Filled 2021-06-26: qty 90, 90d supply, fill #0
  Filled 2021-09-27: qty 90, 90d supply, fill #1
  Filled 2021-12-27: qty 90, 90d supply, fill #2

## 2021-06-26 MED ORDER — PANTOPRAZOLE SODIUM 40 MG PO TBEC
40.0000 mg | DELAYED_RELEASE_TABLET | Freq: Every day | ORAL | 2 refills | Status: DC
Start: 2021-06-26 — End: 2022-02-17
  Filled 2021-06-26: qty 90, 90d supply, fill #0
  Filled 2021-09-27: qty 90, 90d supply, fill #1
  Filled 2021-12-27: qty 90, 90d supply, fill #2

## 2021-06-26 MED ORDER — OLMESARTAN MEDOXOMIL 40 MG PO TABS
40.0000 mg | ORAL_TABLET | Freq: Every day | ORAL | 2 refills | Status: DC
  Filled 2021-06-26: qty 90, 90d supply, fill #0
  Filled 2021-09-27: qty 90, 90d supply, fill #1
  Filled 2021-12-27: qty 90, 90d supply, fill #2

## 2021-06-27 ENCOUNTER — Other Ambulatory Visit (HOSPITAL_COMMUNITY): Payer: Self-pay

## 2021-09-04 ENCOUNTER — Other Ambulatory Visit: Payer: Medicare HMO

## 2021-09-11 ENCOUNTER — Ambulatory Visit: Payer: Medicare HMO | Admitting: Urology

## 2021-09-18 ENCOUNTER — Other Ambulatory Visit: Payer: Medicare HMO

## 2021-09-18 DIAGNOSIS — C61 Malignant neoplasm of prostate: Secondary | ICD-10-CM

## 2021-09-19 LAB — PSA: Prostate Specific Ag, Serum: 0.2 ng/mL (ref 0.0–4.0)

## 2021-09-24 ENCOUNTER — Ambulatory Visit: Payer: Medicare HMO | Admitting: Urology

## 2021-09-25 ENCOUNTER — Ambulatory Visit: Payer: Medicare HMO | Admitting: Urology

## 2021-09-25 ENCOUNTER — Other Ambulatory Visit (HOSPITAL_COMMUNITY): Payer: Self-pay

## 2021-09-25 ENCOUNTER — Encounter: Payer: Self-pay | Admitting: Urology

## 2021-09-25 VITALS — BP 158/78 | HR 83

## 2021-09-25 DIAGNOSIS — N5201 Erectile dysfunction due to arterial insufficiency: Secondary | ICD-10-CM | POA: Diagnosis not present

## 2021-09-25 DIAGNOSIS — N401 Enlarged prostate with lower urinary tract symptoms: Secondary | ICD-10-CM

## 2021-09-25 DIAGNOSIS — R351 Nocturia: Secondary | ICD-10-CM | POA: Diagnosis not present

## 2021-09-25 DIAGNOSIS — N138 Other obstructive and reflux uropathy: Secondary | ICD-10-CM

## 2021-09-25 DIAGNOSIS — C61 Malignant neoplasm of prostate: Secondary | ICD-10-CM

## 2021-09-25 DIAGNOSIS — R300 Dysuria: Secondary | ICD-10-CM

## 2021-09-25 LAB — URINALYSIS, ROUTINE W REFLEX MICROSCOPIC
Bilirubin, UA: NEGATIVE
Glucose, UA: NEGATIVE
Ketones, UA: NEGATIVE
Leukocytes,UA: NEGATIVE
Nitrite, UA: NEGATIVE
Protein,UA: NEGATIVE
Specific Gravity, UA: 1.02 (ref 1.005–1.030)
Urobilinogen, Ur: 0.2 mg/dL (ref 0.2–1.0)
pH, UA: 6 (ref 5.0–7.5)

## 2021-09-25 LAB — MICROSCOPIC EXAMINATION
Bacteria, UA: NONE SEEN
Epithelial Cells (non renal): NONE SEEN /hpf (ref 0–10)
Renal Epithel, UA: NONE SEEN /hpf
WBC, UA: NONE SEEN /hpf (ref 0–5)

## 2021-09-25 MED ORDER — ALFUZOSIN HCL ER 10 MG PO TB24
10.0000 mg | ORAL_TABLET | Freq: Every day | ORAL | 3 refills | Status: DC
  Filled 2021-09-25: qty 90, 90d supply, fill #0
  Filled 2021-12-27: qty 90, 90d supply, fill #1

## 2021-09-25 MED ORDER — TADALAFIL 20 MG PO TABS
20.0000 mg | ORAL_TABLET | Freq: Every day | ORAL | 3 refills | Status: DC
  Filled 2021-09-25: qty 18, 18d supply, fill #0

## 2021-09-25 NOTE — Patient Instructions (Signed)
Prostate Cancer  The prostate is a small gland that produces fluid that makes up semen (seminal fluid). It is located below the bladder in men, in front of the rectum. Prostate cancer is the abnormal growth of cells in the prostate gland. What are the causes? The exact cause of this condition is not known. What increases the risk? You are more likely to develop this condition if: You are 70 years of age or older. You have a family history of prostate cancer. You have a family history of breast and ovarian cancer. You have genes that are passed from parent to child (inherited), such as BRCA1 and BRCA2. You have Lynch syndrome. African American men and men of African descent are diagnosed with prostate cancer at higher rates than other men. The reasons for this are not well understood and are likely due to a combination of genetic and environmental factors. What are the signs or symptoms? Symptoms of this condition include: Problems with urination. This may include: A weak or interrupted flow of urine. Trouble starting or stopping urination. Trouble emptying the bladder all the way. The need to urinate more often, especially at night. Blood in urine or semen. Persistent pain or discomfort in the lower back, lower abdomen, or hips. Trouble getting an erection. Weakness or numbness in the legs or feet. How is this diagnosed? This condition can be diagnosed with: A digital rectal exam. For this exam, a health care provider inserts a gloved finger into the rectum to feel the prostate gland. A blood test called a prostate-specific antigen (PSA) test. A procedure in which a sample of tissue is taken from the prostate and checked under a microscope (prostate biopsy). An imaging test called transrectal ultrasonography. Once the condition is diagnosed, tests will be done to determine how far the cancer has spread. This is called staging the cancer. Staging may involve imaging tests, such as a bone  scan, CT scan, PET scan, or MRI. Stages of prostate cancer The stages of prostate cancer are as follows: Stage 1 (I). At this stage, the cancer is found in the prostate only. The cancer is not visible on imaging tests, and it is usually found by accident, such as during prostate surgery. Stage 2 (II). At this stage, the cancer is more advanced than it is in stage 1, but the cancer has not spread outside the prostate. Stage 3 (III). At this stage, the cancer has spread beyond the outer layer of the prostate to nearby tissues. The cancer may be found in the seminal vesicles, which are near the bladder and the prostate. Stage 4 (IV). At this stage, the cancer has spread to other parts of the body, such as the lymph nodes, bones, bladder, rectum, liver, or lungs. Prostate cancer grading Prostate cancer is also graded according to how the cancer cells look under a microscope. This is called the Gleason score and the total score can range from 6-10, indicating how likely it is that the cancer will spread (metastasize) to other parts of the body. The higher the score, the greater the likelihood that the cancer will spread. Gleason 6 or lower: This indicates that the cancer cells look similar to normal prostate cells (well differentiated). Gleason 7: This indicates that the cancer cells look somewhat similar to normal prostate cells (moderately differentiated). Gleason 8, 9, or 10: This indicates that the cancer cells look very different than normal prostate cells (poorly differentiated). How is this treated? Treatment for this condition depends on several   factors, including the stage of the cancer, your age, personal preferences, and your overall health. Talk with your health care provider about treatment options that are recommended for you. Common treatments include: Observation for early stage prostate cancer (active surveillance). This involves having exams, blood tests, and in some cases, more biopsies.  For some men, this is the only treatment needed. Surgery. Types of surgeries include: Open surgery (radical prostatectomy). In this surgery, a larger incision is made to remove the prostate. A laparoscopic radical prostatectomy. This is a surgery to remove the prostate and lymph nodes through several small incisions. It is often referred to as a minimally invasive surgery. A robotic radical prostatectomy. This is laparoscopic surgery to remove the prostate and lymph nodes with the help of robotic arms that are controlled by the surgeon. Cryoablation. This is surgery to freeze and destroy cancer cells. Radiation treatment. Types of radiation treatment include: External beam radiation. This type aims beams of radiation from outside the body at the prostate to destroy cancerous cells. Brachytherapy. This type uses radioactive needles, seeds, wires, or tubes that are implanted into the prostate gland. Like external beam radiation, brachytherapy destroys cancerous cells. An advantage is that this type of radiation limits the damage to surrounding tissue and has fewer side effects. Chemotherapy. This treatment kills cancer cells or stops them from multiplying. It kills both cancer cells and normal cells. Targeted therapy. This treatment uses medicines to kill cancer cells without damaging normal cells. Hormone treatment. This treatment involves taking medicines that act on testosterone, one of the male hormones, by: Stopping your body from producing testosterone. Blocking testosterone from reaching cancer cells. Follow these instructions at home: Lifestyle Do not use any products that contain nicotine or tobacco. These products include cigarettes, chewing tobacco, and vaping devices, such as e-cigarettes. If you need help quitting, ask your health care provider. Eat a healthy diet. To do this: Eat foods that are high in fiber. These include beans, whole grains, and fresh fruits and vegetables. Limit  foods that are high in fat and sugar. These include fried or sweet foods. Treatment for prostate cancer may affect sexual function. If you have a partner, continue to have intimate moments. This may include touching, holding, hugging, and caressing your partner. Get plenty of sleep. Consider joining a support group for men who have prostate cancer. Meeting with a support group may help you learn to manage the stress of having cancer. General instructions Take over-the-counter and prescription medicines only as told by your health care provider. If you have to go to the hospital, notify your cancer specialist (oncologist). Keep all follow-up visits. This is important. Where to find more information American Cancer Society: www.cancer.org American Society of Clinical Oncology: www.cancer.net National Cancer Institute: www.cancer.gov Contact a health care provider if: You have new or increasing trouble urinating. You have new or increasing blood in your urine. You have new or increasing pain in your hips, back, or chest. Get help right away if: You have weakness or numbness in your legs. You cannot control urination or your bowel movements (incontinence). You have chills or a fever. Summary The prostate is a small gland that is involved in the production of semen. It is located below a man's bladder, in front of the rectum. Prostate cancer is the abnormal growth of cells in the prostate gland. Treatment for this condition depends on the stage of the cancer, your age, personal preferences, and your overall health. Talk with your health care provider about   treatment options that are recommended for you. Consider joining a support group for men who have prostate cancer. Meeting with a support group may help you learn to manage the stress of having cancer. This information is not intended to replace advice given to you by your health care provider. Make sure you discuss any questions you have with  your health care provider. Document Revised: 08/15/2020 Document Reviewed: 08/15/2020 Elsevier Patient Education  2023 Elsevier Inc.  

## 2021-09-25 NOTE — Progress Notes (Signed)
? ?09/25/2021 ?1:43 PM  ? ?Juan Ponce ?January 02, 1951 ?[id number] ? ?Referring provider: Sharilyn Sites, MD ?[address] ?[place],  [place] [address] ? ?Followup prostate cancer and BPH ? ? ?HPI: ?Juan Ponce is a 71yo here for followup for prostate cancer, BPH and erectile dysfunction. PSA decreased to 0.2. IPSS 5 QOL 1 on uroxatral '10mg'$ . Urine stream strong. No straining to urinate. Nocturia 1-2x depending on fluid consumption. He uses tadalafil '20mg'$  PRN for his erectile dysfunction which works well. No other complaints today ? ? ?PMH: ?Past Medical History:  ?Diagnosis Date  ? Hypertension   ? Prostate cancer ([place])   ? ? ?Surgical History: ?Past Surgical History:  ?Procedure Laterality Date  ? CYSTOSCOPY  12/23/2018  ? Procedure: CYSTOSCOPY FLEXIBLE;  Surgeon: Juan Gustin, MD;  Location: [facility];  Service: Urology;;  no seeds found in bladder  ? NO PAST SURGERIES    ? PROSTATE BIOPSY    ? RADIOACTIVE SEED IMPLANT N/A 12/23/2018  ? Procedure: RADIOACTIVE SEED IMPLANT/BRACHYTHERAPY IMPLANT;  Surgeon: Juan Gustin, MD;  Location: [facility];  Service: Urology;  Laterality: N/A;    56   seeds implanted  ? SPACE OAR INSTILLATION N/A 12/23/2018  ? Procedure: SPACE OAR INSTILLATION;  Surgeon: Juan Gustin, MD;  Location: [facility];  Service: Urology;  Laterality: N/A;  ? ? ?Home Medications:  ?Allergies as of 09/25/2021   ?No Known Allergies ?  ? ?  ?Medication List  ?  ? ?  ? Accurate as of September 25, 2021  1:43 PM. If you have any questions, ask your nurse or doctor.  ?  ?  ? ?  ? ?alfuzosin 10 MG 24 hr tablet ?Commonly known as: UROXATRAL ?Take 1 tablet (10 mg total) by mouth daily with breakfast. ?  ?Dilt-XR 240 MG 24 hr capsule ?Generic drug: diltiazem ?  ?Dilt-XR 240 MG 24 hr capsule ?Generic drug: diltiazem ?TAKE 1 CAPSULE BY MOUTH ONCE DAILY AT NIGHT ?  ?Dilt-XR 240 MG 24 hr capsule ?Generic drug: diltiazem ?Take 1 capsule (240 mg total)  by mouth at night. ?  ?Dilt-XR 240 MG 24 hr capsule ?Generic drug: diltiazem ?Take 1 capsule (240 mg total) by mouth at night ?  ?olmesartan 40 MG tablet ?Commonly known as: BENICAR ?  ?olmesartan 40 MG tablet ?Commonly known as: BENICAR ?TAKE 1 TABLET BY MOUTH ONCE DAILY IN MORNING ?  ?olmesartan 40 MG tablet ?Commonly known as: BENICAR ?Take 1 tablet (40 mg total) by mouth in the morning. ?  ?olmesartan 40 MG tablet ?Commonly known as: BENICAR ?Take 1 tablet (40 mg total) by mouth daily. ?  ?pantoprazole 40 MG tablet ?Commonly known as: PROTONIX ?Take 1 tablet (40 mg total) by mouth daily. ?  ?pantoprazole 40 MG tablet ?Commonly known as: PROTONIX ?Take 1 tablet (40 mg total) by mouth daily. ?  ?tadalafil 20 MG tablet ?Commonly known as: CIALIS ?TAKE 1 TABLET (20 MG TOTAL) BY MOUTH DAILY AS NEEDED FOR ERECTILE DYSFUNCTION. ?  ?Telmisartan-amLODIPine 40-10 MG Tabs ?  ?traMADol 50 MG tablet ?Commonly known as: ULTRAM ?Take by mouth every 6 (six) hours as needed. ?  ? ?  ? ? ?Allergies: No Known Allergies ? ?Family History: ?Family History  ?Problem Relation Age of Onset  ? Prostate cancer Brother   ? Breast cancer Sister   ? Lung cancer Brother   ? Colon cancer Neg Hx   ? Pancreatic cancer Neg Hx   ? ? ?Social  History:  reports that he has never smoked. He has never used smokeless tobacco. He reports current alcohol use. He reports that he does not use drugs. ? ?ROS: ?All other review of systems were reviewed and are negative except what is noted above in HPI ? ?Physical Exam: ?BP (!) 158/78   Pulse 83   ?Constitutional:  Alert and oriented, No acute distress. ?HEENT: [place] AT, moist mucus membranes.  Trachea midline, no masses. ?Cardiovascular: No clubbing, cyanosis, or edema. ?Respiratory: Normal respiratory effort, no increased work of breathing. ?GI: Abdomen is soft, nontender, nondistended, no abdominal masses ?GU: No CVA tenderness.  ?Lymph: No cervical or inguinal lymphadenopathy. ?Skin: No rashes, bruises or  suspicious lesions. ?Neurologic: Grossly intact, no focal deficits, moving all 4 extremities. ?Psychiatric: Normal mood and affect. ? ?Laboratory Data: ?Lab Results  ?Component Value Date  ? WBC 7.0 12/20/2018  ? HGB 13.7 12/20/2018  ? HCT 41.2 12/20/2018  ? MCV 97.4 12/20/2018  ? PLT 246 12/20/2018  ? ? ?Lab Results  ?Component Value Date  ? CREATININE 0.87 12/20/2018  ? ? ?Lab Results  ?Component Value Date  ? PSA 0.6 09/26/2019  ? ? ?No results found for: TESTOSTERONE ? ?No results found for: HGBA1C ? ?Urinalysis ?   ?Component Value Date/Time  ? APPEARANCEUR Clear 06/27/2020 1315  ? GLUCOSEU Negative 06/27/2020 1315  ? BILIRUBINUR Negative 06/27/2020 1315  ? PROTEINUR Negative 06/27/2020 1315  ? NITRITE Negative 06/27/2020 1315  ? LEUKOCYTESUR Negative 06/27/2020 1315  ? ? ?Lab Results  ?Component Value Date  ? LABMICR Comment 06/27/2020  ? ? ?Pertinent Imaging: ? ?No results found for this or any previous visit. ? ?No results found for this or any previous visit. ? ?No results found for this or any previous visit. ? ?No results found for this or any previous visit. ? ?No results found for this or any previous visit. ? ?No results found for this or any previous visit. ? ?No results found for this or any previous visit. ? ?No results found for this or any previous visit. ? ? ?Assessment & Plan:   ? ?1. Prostate cancer ([place]) ?-RTC 6 months with PSA ?- Urinalysis, Routine w reflex microscopic ? ?2. Benign prostatic hyperplasia with urinary obstruction ?-Continue uroxatral '10mg'$  qhs ? ?3. Nocturia ?-continue uroxatral '10mg'$  qhs ? ?4. Erectile dysfunction due to arterial insufficiency ?-continue tadalafil '20mg'$  prn ? ? ?No follow-ups on file. ? ?Juan Bang, MD ? ?[facility] ?  ?

## 2021-09-27 ENCOUNTER — Other Ambulatory Visit (HOSPITAL_COMMUNITY): Payer: Self-pay

## 2021-09-30 ENCOUNTER — Other Ambulatory Visit (HOSPITAL_COMMUNITY): Payer: Self-pay

## 2021-12-27 ENCOUNTER — Other Ambulatory Visit (HOSPITAL_COMMUNITY): Payer: Self-pay

## 2022-02-17 ENCOUNTER — Other Ambulatory Visit (HOSPITAL_COMMUNITY): Payer: Self-pay

## 2022-02-17 DIAGNOSIS — Z23 Encounter for immunization: Secondary | ICD-10-CM | POA: Diagnosis not present

## 2022-02-17 DIAGNOSIS — C61 Malignant neoplasm of prostate: Secondary | ICD-10-CM | POA: Diagnosis not present

## 2022-02-17 DIAGNOSIS — Z1331 Encounter for screening for depression: Secondary | ICD-10-CM | POA: Diagnosis not present

## 2022-02-17 DIAGNOSIS — Z0001 Encounter for general adult medical examination with abnormal findings: Secondary | ICD-10-CM | POA: Diagnosis not present

## 2022-02-17 DIAGNOSIS — I10 Essential (primary) hypertension: Secondary | ICD-10-CM | POA: Diagnosis not present

## 2022-02-17 DIAGNOSIS — E663 Overweight: Secondary | ICD-10-CM | POA: Diagnosis not present

## 2022-02-17 DIAGNOSIS — Z6828 Body mass index (BMI) 28.0-28.9, adult: Secondary | ICD-10-CM | POA: Diagnosis not present

## 2022-02-17 MED ORDER — PANTOPRAZOLE SODIUM 40 MG PO TBEC
40.0000 mg | DELAYED_RELEASE_TABLET | Freq: Every day | ORAL | 3 refills | Status: DC
  Filled 2022-02-17 – 2022-03-27 (×2): qty 90, 90d supply, fill #0
  Filled 2022-07-01: qty 90, 90d supply, fill #1
  Filled 2022-09-30: qty 90, 90d supply, fill #2
  Filled 2023-01-05: qty 90, 90d supply, fill #3

## 2022-02-17 MED ORDER — SILDENAFIL CITRATE 20 MG PO TABS
20.0000 mg | ORAL_TABLET | Freq: Every day | ORAL | 3 refills | Status: DC
  Filled 2022-02-17: qty 90, 90d supply, fill #0

## 2022-02-17 MED ORDER — DILTIAZEM HCL ER 240 MG PO CP24
240.0000 mg | ORAL_CAPSULE | Freq: Every evening | ORAL | 3 refills | Status: DC
  Filled 2022-02-17 – 2022-03-27 (×2): qty 90, 90d supply, fill #0
  Filled 2022-07-01: qty 90, 90d supply, fill #1
  Filled 2022-09-30: qty 90, 90d supply, fill #2
  Filled 2023-01-05: qty 90, 90d supply, fill #3

## 2022-02-17 MED ORDER — OLMESARTAN MEDOXOMIL 40 MG PO TABS
40.0000 mg | ORAL_TABLET | Freq: Every day | ORAL | 3 refills | Status: DC
Start: 2022-02-17 — End: 2023-02-19
  Filled 2022-02-17 – 2022-03-27 (×2): qty 90, 90d supply, fill #0
  Filled 2022-07-01: qty 90, 90d supply, fill #1
  Filled 2022-09-30: qty 90, 90d supply, fill #2
  Filled 2023-01-05: qty 90, 90d supply, fill #3

## 2022-02-17 MED ORDER — ALFUZOSIN HCL ER 10 MG PO TB24
10.0000 mg | ORAL_TABLET | Freq: Every day | ORAL | 3 refills | Status: DC
Start: 2022-02-17 — End: 2022-03-26
  Filled 2022-02-17: qty 90, 90d supply, fill #0

## 2022-02-18 ENCOUNTER — Other Ambulatory Visit (HOSPITAL_COMMUNITY): Payer: Self-pay

## 2022-03-19 ENCOUNTER — Other Ambulatory Visit: Payer: Medicare HMO

## 2022-03-19 DIAGNOSIS — C61 Malignant neoplasm of prostate: Secondary | ICD-10-CM | POA: Diagnosis not present

## 2022-03-20 LAB — PSA: Prostate Specific Ag, Serum: 0.3 ng/mL (ref 0.0–4.0)

## 2022-03-26 ENCOUNTER — Ambulatory Visit: Payer: Medicare HMO | Admitting: Urology

## 2022-03-26 ENCOUNTER — Other Ambulatory Visit: Payer: Self-pay

## 2022-03-26 ENCOUNTER — Other Ambulatory Visit (HOSPITAL_COMMUNITY): Payer: Self-pay

## 2022-03-26 ENCOUNTER — Encounter: Payer: Self-pay | Admitting: Urology

## 2022-03-26 VITALS — BP 186/89 | HR 83

## 2022-03-26 DIAGNOSIS — N138 Other obstructive and reflux uropathy: Secondary | ICD-10-CM | POA: Diagnosis not present

## 2022-03-26 DIAGNOSIS — N401 Enlarged prostate with lower urinary tract symptoms: Secondary | ICD-10-CM | POA: Diagnosis not present

## 2022-03-26 DIAGNOSIS — C61 Malignant neoplasm of prostate: Secondary | ICD-10-CM | POA: Diagnosis not present

## 2022-03-26 DIAGNOSIS — N5201 Erectile dysfunction due to arterial insufficiency: Secondary | ICD-10-CM

## 2022-03-26 DIAGNOSIS — R351 Nocturia: Secondary | ICD-10-CM | POA: Diagnosis not present

## 2022-03-26 LAB — URINALYSIS, ROUTINE W REFLEX MICROSCOPIC
Bilirubin, UA: NEGATIVE
Glucose, UA: NEGATIVE
Ketones, UA: NEGATIVE
Leukocytes,UA: NEGATIVE
Nitrite, UA: NEGATIVE
Protein,UA: NEGATIVE
RBC, UA: NEGATIVE
Specific Gravity, UA: 1.01 (ref 1.005–1.030)
Urobilinogen, Ur: 0.2 mg/dL (ref 0.2–1.0)
pH, UA: 6.5 (ref 5.0–7.5)

## 2022-03-26 MED ORDER — SILDENAFIL CITRATE 20 MG PO TABS
20.0000 mg | ORAL_TABLET | Freq: Every day | ORAL | 3 refills | Status: AC
  Filled 2022-03-26: qty 90, 90d supply, fill #0

## 2022-03-26 MED ORDER — ALFUZOSIN HCL ER 10 MG PO TB24
10.0000 mg | ORAL_TABLET | Freq: Every day | ORAL | 3 refills | Status: DC
  Filled 2022-03-26: qty 90, 90d supply, fill #0
  Filled 2022-07-01: qty 90, 90d supply, fill #1
  Filled 2022-09-30: qty 90, 90d supply, fill #2

## 2022-03-26 NOTE — Patient Instructions (Signed)

## 2022-03-26 NOTE — Progress Notes (Signed)
03/26/2022 11:47 AM   Juan Ponce [birth year] [id number]  Referring provider: Sharilyn Sites, MD [address] [place],  [place] [address]  Followup prostate cancer and BPH   HPI: Mr Juan Ponce is a 71yo here for followup for prostate cancer, BPH and erectile dysfunction. PSA stable at 0.3. He has stable LUTS on uroxatral '10mg'$  daily IPSS 4 QOL 0. Nocturia 1-2x. Urine stream strong. He takes sildenafil prn for his erections which works well.    PMH: Past Medical History:  Diagnosis Date   Hypertension    Prostate cancer [facility]     Surgical History: Past Surgical History:  Procedure Laterality Date   CYSTOSCOPY  12/23/2018   Procedure: CYSTOSCOPY FLEXIBLE;  Surgeon: [place] Gustin, MD;  Location: [facility];  Service: Urology;;  no seeds found in bladder   NO PAST SURGERIES     PROSTATE BIOPSY     RADIOACTIVE SEED IMPLANT N/A 12/23/2018   Procedure: RADIOACTIVE SEED IMPLANT/BRACHYTHERAPY IMPLANT;  Surgeon: Cleon Gustin, MD;  Location: [facility];  Service: Urology;  Laterality: N/A;    56   seeds implanted   SPACE OAR INSTILLATION N/A 12/23/2018   Procedure: SPACE OAR INSTILLATION;  Surgeon: Cleon Gustin, MD;  Location: [facility];  Service: Urology;  Laterality: N/A;    Home Medications:  Allergies as of 03/26/2022   No Known Allergies      Medication List        Accurate as of March 26, 2022 11:47 AM. If you have any questions, ask your nurse or doctor.          alfuzosin 10 MG 24 hr tablet Commonly known as: UROXATRAL Take 1 tablet (10 mg total) by mouth daily with breakfast.   alfuzosin 10 MG 24 hr tablet Commonly known as: UROXATRAL Take 1 tablet (10 mg total) by mouth daily.   Dilt-XR 240 MG 24 hr capsule Generic drug: diltiazem   Dilt-XR 240 MG 24 hr capsule Generic drug: diltiazem TAKE 1 CAPSULE BY MOUTH ONCE DAILY AT NIGHT   Dilt-XR 240 MG 24 hr capsule Generic drug:  diltiazem Take 1 capsule (240 mg total) by mouth at night.   diltiazem 240 MG 24 hr capsule Commonly known as: Dilt-XR Take 1 capsule (240 mg total) by mouth Nightly.   olmesartan 40 MG tablet Commonly known as: BENICAR   olmesartan 40 MG tablet Commonly known as: BENICAR TAKE 1 TABLET BY MOUTH ONCE DAILY IN MORNING   olmesartan 40 MG tablet Commonly known as: BENICAR Take 1 tablet (40 mg total) by mouth in the morning.   olmesartan 40 MG tablet Commonly known as: BENICAR Take 1 tablet (40 mg total) by mouth daily.   pantoprazole 40 MG tablet Commonly known as: PROTONIX Take 1 tablet (40 mg total) by mouth daily.   pantoprazole 40 MG tablet Commonly known as: PROTONIX Take 1 tablet (40 mg total) by mouth daily.   sildenafil 20 MG tablet Commonly known as: REVATIO Take 1 tablet (20 mg total) by mouth daily.   tadalafil 20 MG tablet Commonly known as: CIALIS Take 1 tablet (20 mg total) by mouth daily as needed for erectile dysfunction   Telmisartan-amLODIPine 40-10 MG Tabs   traMADol 50 MG tablet Commonly known as: ULTRAM Take by mouth every 6 (six) hours as needed.        Allergies: No Known Allergies  Family History: Family History  Problem Relation Age of Onset   Prostate cancer Brother  Breast cancer Sister    Lung cancer Brother    Colon cancer Neg Hx    Pancreatic cancer Neg Hx     Social History:  reports that he has never smoked. He has never used smokeless tobacco. He reports current alcohol use. He reports that he does not use drugs.  ROS: All other review of systems were reviewed and are negative except what is noted above in HPI  Physical Exam: BP (!) 186/89   Pulse 83   Constitutional:  Alert and oriented, No acute distress. HEENT: [place] AT, moist mucus membranes.  Trachea midline, no masses. Cardiovascular: No clubbing, cyanosis, or edema. Respiratory: Normal respiratory effort, no increased work of breathing. GI: Abdomen is soft,  nontender, nondistended, no abdominal masses GU: No CVA tenderness.  Lymph: No cervical or inguinal lymphadenopathy. Skin: No rashes, bruises or suspicious lesions. Neurologic: Grossly intact, no focal deficits, moving all 4 extremities. Psychiatric: Normal mood and affect.  Laboratory Data: Lab Results  Component Value Date   WBC 7.0 12/20/2018   HGB 13.7 12/20/2018   HCT 41.2 12/20/2018   MCV 97.4 12/20/2018   PLT 246 12/20/2018    Lab Results  Component Value Date   CREATININE 0.87 12/20/2018    Lab Results  Component Value Date   PSA 0.6 09/26/2019    No results found for: "TESTOSTERONE"  No results found for: "HGBA1C"  Urinalysis    Component Value Date/Time   APPEARANCEUR Clear 09/25/2021 1643   GLUCOSEU Negative 09/25/2021 1643   BILIRUBINUR Negative 09/25/2021 1643   PROTEINUR Negative 09/25/2021 1643   NITRITE Negative 09/25/2021 1643   LEUKOCYTESUR Negative 09/25/2021 1643    Lab Results  Component Value Date   LABMICR See below: 09/25/2021   WBCUA None seen 09/25/2021   LABEPIT None seen 09/25/2021   BACTERIA None seen 09/25/2021    Pertinent Imaging:  No results found for this or any previous visit.  No results found for this or any previous visit.  No results found for this or any previous visit.  No results found for this or any previous visit.  No results found for this or any previous visit.  No valid procedures specified. No results found for this or any previous visit.  No results found for this or any previous visit.   Assessment & Plan:    1. Prostate cancer ([place]) RTC 6 months with PSA - Urinalysis, Routine w reflex microscopic  2. Benign prostatic hyperplasia with urinary obstruction -continue uroxatral '10mg'$  daily  3. Nocturia -Continue uroxatral '10mg'$  qhs  4. Erectile dysfunction -Sildenafil prn   No follow-ups on file.  Nicolette Bang, MD  [facility] [place]

## 2022-03-27 ENCOUNTER — Other Ambulatory Visit (HOSPITAL_COMMUNITY): Payer: Self-pay

## 2022-07-01 ENCOUNTER — Other Ambulatory Visit: Payer: Self-pay

## 2022-07-01 ENCOUNTER — Other Ambulatory Visit (HOSPITAL_COMMUNITY): Payer: Self-pay

## 2022-08-26 DIAGNOSIS — I10 Essential (primary) hypertension: Secondary | ICD-10-CM | POA: Diagnosis not present

## 2022-09-19 ENCOUNTER — Ambulatory Visit: Payer: Medicare HMO | Admitting: Urology

## 2022-09-22 ENCOUNTER — Other Ambulatory Visit: Payer: Medicare HMO

## 2022-09-22 DIAGNOSIS — C61 Malignant neoplasm of prostate: Secondary | ICD-10-CM | POA: Diagnosis not present

## 2022-09-23 LAB — PSA: Prostate Specific Ag, Serum: <0.1 ng/mL (ref 0.0–4.0)

## 2022-09-25 NOTE — Progress Notes (Signed)
Letter sent.

## 2022-09-30 ENCOUNTER — Other Ambulatory Visit (HOSPITAL_COMMUNITY): Payer: Self-pay

## 2022-10-07 ENCOUNTER — Ambulatory Visit: Payer: Medicare HMO | Admitting: Urology

## 2022-10-07 ENCOUNTER — Other Ambulatory Visit (HOSPITAL_COMMUNITY): Payer: Self-pay

## 2022-10-07 VITALS — BP 146/76 | HR 91

## 2022-10-07 DIAGNOSIS — R300 Dysuria: Secondary | ICD-10-CM

## 2022-10-07 DIAGNOSIS — N401 Enlarged prostate with lower urinary tract symptoms: Secondary | ICD-10-CM | POA: Diagnosis not present

## 2022-10-07 DIAGNOSIS — R351 Nocturia: Secondary | ICD-10-CM

## 2022-10-07 DIAGNOSIS — N5201 Erectile dysfunction due to arterial insufficiency: Secondary | ICD-10-CM

## 2022-10-07 DIAGNOSIS — N138 Other obstructive and reflux uropathy: Secondary | ICD-10-CM | POA: Diagnosis not present

## 2022-10-07 DIAGNOSIS — C61 Malignant neoplasm of prostate: Secondary | ICD-10-CM | POA: Diagnosis not present

## 2022-10-07 MED ORDER — TADALAFIL 20 MG PO TABS
20.0000 mg | ORAL_TABLET | Freq: Every day | ORAL | 11 refills | Status: DC
  Filled 2022-10-07: qty 18, 18d supply, fill #0

## 2022-10-07 MED ORDER — ALFUZOSIN HCL ER 10 MG PO TB24
10.0000 mg | ORAL_TABLET | Freq: Every day | ORAL | 3 refills | Status: DC
  Filled 2022-10-07: qty 90, 90d supply, fill #0

## 2022-10-07 NOTE — Progress Notes (Signed)
10/07/2022 2:56 PM   Christella Scheuermann [birth year] [id number]  Referring provider: Assunta Found, MD [address] [place],  [place] [address]  Followup prostate cancer and BPH   HPI: Mr Juan Ponce is a 72yo here for followup for prostate cancer, erectile dysfunction and BPH. PSA undetectable. IPSS 2 QOL 0 on uroxatral 10mg  daily. Nocturia 0x. Urine stream strong He takes tadalafil prn with good results   PMH: Past Medical History:  Diagnosis Date   Hypertension    Prostate cancer [facility])     Surgical History: Past Surgical History:  Procedure Laterality Date   CYSTOSCOPY  12/23/2018   Procedure: CYSTOSCOPY FLEXIBLE;  Surgeon: Malen Gauze, MD;  Location: [facility];  Service: Urology;;  no seeds found in bladder   NO PAST SURGERIES     PROSTATE BIOPSY     RADIOACTIVE SEED IMPLANT N/A 12/23/2018   Procedure: RADIOACTIVE SEED IMPLANT/BRACHYTHERAPY IMPLANT;  Surgeon: Malen Gauze, MD;  Location: [facility];  Service: Urology;  Laterality: N/A;    56   seeds implanted   SPACE OAR INSTILLATION N/A 12/23/2018   Procedure: SPACE OAR INSTILLATION;  Surgeon: Malen Gauze, MD;  Location: [facility];  Service: Urology;  Laterality: N/A;    Home Medications:  Allergies as of 10/07/2022   No Known Allergies      Medication List        Accurate as of Oct 07, 2022  2:56 PM. If you have any questions, ask your nurse or doctor.          alfuzosin 10 MG 24 hr tablet Commonly known as: UROXATRAL Take 1 tablet (10 mg total) by mouth daily with breakfast.   alfuzosin 10 MG 24 hr tablet Commonly known as: UROXATRAL Take 1 tablet (10 mg total) by mouth daily.   Dilt-XR 240 MG 24 hr capsule Generic drug: diltiazem   Dilt-XR 240 MG 24 hr capsule Generic drug: diltiazem TAKE 1 CAPSULE BY MOUTH ONCE DAILY AT NIGHT   Dilt-XR 240 MG 24 hr capsule Generic drug: diltiazem Take 1 capsule (240 mg total) by mouth  at night.   diltiazem 240 MG 24 hr capsule Commonly known as: Dilt-XR Take 1 capsule (240 mg total) by mouth at night   olmesartan 40 MG tablet Commonly known as: BENICAR   olmesartan 40 MG tablet Commonly known as: BENICAR TAKE 1 TABLET BY MOUTH ONCE DAILY IN MORNING   olmesartan 40 MG tablet Commonly known as: BENICAR Take 1 tablet (40 mg total) by mouth in the morning.   olmesartan 40 MG tablet Commonly known as: BENICAR Take 1 tablet (40 mg total) by mouth daily.   pantoprazole 40 MG tablet Commonly known as: PROTONIX Take 1 tablet (40 mg total) by mouth daily.   pantoprazole 40 MG tablet Commonly known as: PROTONIX Take 1 tablet (40 mg total) by mouth daily.   sildenafil 20 MG tablet Commonly known as: REVATIO Take 1 tablet (20 mg total) by mouth daily.   tadalafil 20 MG tablet Commonly known as: CIALIS Take 1 tablet (20 mg total) by mouth daily as needed for erectile dysfunction   Telmisartan-amLODIPine 40-10 MG Tabs   traMADol 50 MG tablet Commonly known as: ULTRAM Take by mouth every 6 (six) hours as needed.        Allergies: No Known Allergies  Family History: Family History  Problem Relation Age of Onset   Prostate cancer Brother    Breast cancer Sister  Lung cancer Brother    Colon cancer Neg Hx    Pancreatic cancer Neg Hx     Social History:  reports that he has never smoked. He has never used smokeless tobacco. He reports current alcohol use. He reports that he does not use drugs.  ROS: All other review of systems were reviewed and are negative except what is noted above in HPI  Physical Exam: BP (!) 146/76   Pulse 91   Constitutional:  Alert and oriented, No acute distress. HEENT: [place] AT, moist mucus membranes.  Trachea midline, no masses. Cardiovascular: No clubbing, cyanosis, or edema. Respiratory: Normal respiratory effort, no increased work of breathing. GI: Abdomen is soft, nontender, nondistended, no abdominal masses GU: No  CVA tenderness.  Lymph: No cervical or inguinal lymphadenopathy. Skin: No rashes, bruises or suspicious lesions. Neurologic: Grossly intact, no focal deficits, moving all 4 extremities. Psychiatric: Normal mood and affect.  Laboratory Data: Lab Results  Component Value Date   WBC 7.0 12/20/2018   HGB 13.7 12/20/2018   HCT 41.2 12/20/2018   MCV 97.4 12/20/2018   PLT 246 12/20/2018    Lab Results  Component Value Date   CREATININE 0.87 12/20/2018    Lab Results  Component Value Date   PSA 0.6 09/26/2019    No results found for: "TESTOSTERONE"  No results found for: "HGBA1C"  Urinalysis    Component Value Date/Time   APPEARANCEUR Clear 03/26/2022 1132   GLUCOSEU Negative 03/26/2022 1132   BILIRUBINUR Negative 03/26/2022 1132   PROTEINUR Negative 03/26/2022 1132   NITRITE Negative 03/26/2022 1132   LEUKOCYTESUR Negative 03/26/2022 1132    Lab Results  Component Value Date   LABMICR Comment 03/26/2022   WBCUA None seen 09/25/2021   LABEPIT None seen 09/25/2021   BACTERIA None seen 09/25/2021    Pertinent Imaging:  No results found for this or any previous visit.  No results found for this or any previous visit.  No results found for this or any previous visit.  No results found for this or any previous visit.  No results found for this or any previous visit.  No valid procedures specified. No results found for this or any previous visit.  No results found for this or any previous visit.   Assessment & Plan:    1. Prostate cancer (HCC) Followup 1 year with PSA - Urinalysis, Routine w reflex microscopic  2. Benign prostatic hyperplasia with urinary obstruction -continue uroxaral 10mg  daily  3. Nocturia -continue uroxatral 10mg  daily  4. Erectile dysfunction due to arterial insufficiency -tadalafil 20mg  prn   No follow-ups on file.  Wilkie Aye, MD  [facility] [place]

## 2022-10-08 LAB — URINALYSIS, ROUTINE W REFLEX MICROSCOPIC
Bilirubin, UA: NEGATIVE
Glucose, UA: NEGATIVE
Ketones, UA: NEGATIVE
Leukocytes,UA: NEGATIVE
Nitrite, UA: NEGATIVE
Protein,UA: NEGATIVE
RBC, UA: NEGATIVE
Specific Gravity, UA: 1.025 (ref 1.005–1.030)
Urobilinogen, Ur: 2 mg/dL — ABNORMAL HIGH (ref 0.2–1.0)
pH, UA: 6.5 (ref 5.0–7.5)

## 2022-10-14 ENCOUNTER — Encounter: Payer: Self-pay | Admitting: Urology

## 2022-10-14 NOTE — Patient Instructions (Signed)

## 2022-10-21 DIAGNOSIS — H5203 Hypermetropia, bilateral: Secondary | ICD-10-CM | POA: Diagnosis not present

## 2023-01-06 ENCOUNTER — Other Ambulatory Visit (HOSPITAL_COMMUNITY): Payer: Self-pay

## 2023-02-19 ENCOUNTER — Other Ambulatory Visit (HOSPITAL_COMMUNITY): Payer: Self-pay

## 2023-02-19 DIAGNOSIS — I10 Essential (primary) hypertension: Secondary | ICD-10-CM | POA: Diagnosis not present

## 2023-02-19 DIAGNOSIS — E7849 Other hyperlipidemia: Secondary | ICD-10-CM | POA: Diagnosis not present

## 2023-02-19 DIAGNOSIS — E782 Mixed hyperlipidemia: Secondary | ICD-10-CM | POA: Diagnosis not present

## 2023-02-19 DIAGNOSIS — Z6826 Body mass index (BMI) 26.0-26.9, adult: Secondary | ICD-10-CM | POA: Diagnosis not present

## 2023-02-19 DIAGNOSIS — Z1331 Encounter for screening for depression: Secondary | ICD-10-CM | POA: Diagnosis not present

## 2023-02-19 DIAGNOSIS — C61 Malignant neoplasm of prostate: Secondary | ICD-10-CM | POA: Diagnosis not present

## 2023-02-19 DIAGNOSIS — E663 Overweight: Secondary | ICD-10-CM | POA: Diagnosis not present

## 2023-02-19 DIAGNOSIS — Z0001 Encounter for general adult medical examination with abnormal findings: Secondary | ICD-10-CM | POA: Diagnosis not present

## 2023-02-19 DIAGNOSIS — Z23 Encounter for immunization: Secondary | ICD-10-CM | POA: Diagnosis not present

## 2023-02-19 DIAGNOSIS — R945 Abnormal results of liver function studies: Secondary | ICD-10-CM | POA: Diagnosis not present

## 2023-02-19 MED ORDER — OLMESARTAN MEDOXOMIL 40 MG PO TABS
40.0000 mg | ORAL_TABLET | Freq: Every day | ORAL | 3 refills | Status: DC
  Filled 2023-02-19 – 2023-04-13 (×2): qty 90, 90d supply, fill #0
  Filled 2023-07-15: qty 90, 90d supply, fill #1
  Filled 2023-10-06: qty 90, 90d supply, fill #2
  Filled 2024-01-12: qty 90, 90d supply, fill #3

## 2023-02-19 MED ORDER — DILTIAZEM HCL ER 240 MG PO CP24
240.0000 mg | ORAL_CAPSULE | Freq: Every evening | ORAL | 3 refills | Status: DC
  Filled 2023-02-19 – 2023-04-13 (×2): qty 90, 90d supply, fill #0
  Filled 2023-07-15: qty 90, 90d supply, fill #1
  Filled 2023-10-06: qty 90, 90d supply, fill #2
  Filled 2024-01-18: qty 90, 90d supply, fill #3

## 2023-02-19 MED ORDER — ALFUZOSIN HCL ER 10 MG PO TB24
10.0000 mg | ORAL_TABLET | Freq: Every day | ORAL | 3 refills | Status: DC
  Filled 2023-02-19: qty 90, 90d supply, fill #0
  Filled 2023-07-15: qty 90, 90d supply, fill #1
  Filled 2023-10-06: qty 90, 90d supply, fill #2
  Filled 2024-01-18: qty 90, 90d supply, fill #3

## 2023-02-19 MED ORDER — PANTOPRAZOLE SODIUM 40 MG PO TBEC
40.0000 mg | DELAYED_RELEASE_TABLET | Freq: Every day | ORAL | 3 refills | Status: DC
  Filled 2023-02-19 – 2023-04-13 (×2): qty 90, 90d supply, fill #0
  Filled 2023-07-15: qty 90, 90d supply, fill #1
  Filled 2023-10-06: qty 90, 90d supply, fill #2
  Filled 2024-01-18: qty 90, 90d supply, fill #3

## 2023-03-02 DIAGNOSIS — H25813 Combined forms of age-related cataract, bilateral: Secondary | ICD-10-CM | POA: Diagnosis not present

## 2023-03-02 DIAGNOSIS — H01001 Unspecified blepharitis right upper eyelid: Secondary | ICD-10-CM | POA: Diagnosis not present

## 2023-03-02 DIAGNOSIS — H01004 Unspecified blepharitis left upper eyelid: Secondary | ICD-10-CM | POA: Diagnosis not present

## 2023-03-02 DIAGNOSIS — H01002 Unspecified blepharitis right lower eyelid: Secondary | ICD-10-CM | POA: Diagnosis not present

## 2023-04-06 DIAGNOSIS — H25811 Combined forms of age-related cataract, right eye: Secondary | ICD-10-CM | POA: Diagnosis not present

## 2023-04-13 ENCOUNTER — Other Ambulatory Visit: Payer: Self-pay

## 2023-04-13 ENCOUNTER — Other Ambulatory Visit (HOSPITAL_COMMUNITY): Payer: Self-pay

## 2023-04-13 ENCOUNTER — Encounter (HOSPITAL_COMMUNITY)
Admission: RE | Admit: 2023-04-13 | Discharge: 2023-04-13 | Disposition: A | Discharge status: Home / Self Care | Payer: Medicare HMO | Source: Ambulatory Visit | Attending: Ophthalmology | Admitting: Ophthalmology

## 2023-04-13 ENCOUNTER — Encounter (HOSPITAL_COMMUNITY): Payer: Self-pay

## 2023-04-13 NOTE — H&P (Signed)
Surgical History & Physical  Patient Name: Juan Ponce  DOB: 23-Feb-1951  Surgery: Cataract extraction with intraocular lens implant phacoemulsification; Right Eye Surgeon: Fabio Pierce MD Surgery Date: 04/17/2023 Pre-Op Date: 03/02/2023  HPI: A 40 Yr. old male patient present for cataract eval per Dr. Charise Killian. 1. The patient complains of difficulty when driving, when he meets cars it's hard to recognize them, when driving at night he has glare problems with headlights, which began for an unknown amount of time. Both eyes are affected, OD>OS. The episode is constant. The condition's severity is worsening. This is negatively affecting the patient's quality of life and the patient is unable to function adequately in life with the current level of vision. HPI was performed by Fabio Pierce .  Medical History:  Cancer High Blood Pressure acid reflux  Review of Systems Cardiovascular High Blood Pressure Gastrointestinal Acid reflux All recorded systems are negative except as noted above.  Social Never smoked   Medication Visine,  diltiazem HCl ,  pantoprazole ,  olmesartan ,  alfuzosin   Sx/Procedures Prostate CA surgery  Drug Allergies  NKDA  History & Physical: Heent: cataract  NECK: supple without bruits LUNGS: lungs clear to auscultation CV: regular rate and rhythm Abdomen: soft and non-tender  Impression & Plan: Assessment: 1.  COMBINED FORMS AGE RELATED CATARACT; Both Eyes (H25.813) 2.  BLEPHARITIS; Right Upper Lid, Right Lower Lid, Left Upper Lid, Left Lower Lid (H01.001, H01.002,H01.004,H01.005) 3.  ASTIGMATISM, REGULAR; Both Eyes (H52.223)  Plan: 1.  Cataract accounts for the patient's decreased vision. This visual impairment is not correctable with a tolerable change in glasses or contact lenses. Cataract surgery with an implantation of a new lens should significantly improve the visual and functional status of the patient. Discussed all risks, benefits,  alternatives, and potential complications. Discussed the procedures and recovery. Patient desires to have surgery. A-scan ordered and performed today for intra-ocular lens calculations. The surgery will be performed in order to improve vision for driving, reading, and for eye examinations. Recommend phacoemulsification with intra-ocular lens. Recommend Dextenza for post-operative pain and inflammation. Right Eye worse - first. Dilates well - shugarcaine by protocol. Recommend Toric Lens.  2.  Blepharitis is present - recommend regular lid cleaning.  3.  Recommend toric IOL OU

## 2023-04-17 ENCOUNTER — Ambulatory Visit (HOSPITAL_COMMUNITY): Payer: Medicare HMO | Admitting: Anesthesiology

## 2023-04-17 ENCOUNTER — Other Ambulatory Visit: Payer: Self-pay

## 2023-04-17 ENCOUNTER — Encounter (HOSPITAL_COMMUNITY)
Admission: RE | Disposition: A | Discharge status: Home / Self Care | Payer: Self-pay | Source: Home / Self Care | Attending: Ophthalmology

## 2023-04-17 ENCOUNTER — Encounter (HOSPITAL_COMMUNITY): Payer: Self-pay | Admitting: Ophthalmology

## 2023-04-17 ENCOUNTER — Ambulatory Visit (HOSPITAL_COMMUNITY)
Admission: RE | Admit: 2023-04-17 | Discharge: 2023-04-17 | Disposition: A | Discharge status: Home / Self Care | Payer: Medicare HMO | Attending: Ophthalmology | Admitting: Ophthalmology

## 2023-04-17 DIAGNOSIS — H01002 Unspecified blepharitis right lower eyelid: Secondary | ICD-10-CM | POA: Diagnosis not present

## 2023-04-17 DIAGNOSIS — H25811 Combined forms of age-related cataract, right eye: Secondary | ICD-10-CM | POA: Insufficient documentation

## 2023-04-17 DIAGNOSIS — Z79899 Other long term (current) drug therapy: Secondary | ICD-10-CM | POA: Insufficient documentation

## 2023-04-17 DIAGNOSIS — C61 Malignant neoplasm of prostate: Secondary | ICD-10-CM | POA: Diagnosis not present

## 2023-04-17 DIAGNOSIS — H01004 Unspecified blepharitis left upper eyelid: Secondary | ICD-10-CM | POA: Diagnosis not present

## 2023-04-17 DIAGNOSIS — H01005 Unspecified blepharitis left lower eyelid: Secondary | ICD-10-CM | POA: Insufficient documentation

## 2023-04-17 DIAGNOSIS — I10 Essential (primary) hypertension: Secondary | ICD-10-CM | POA: Insufficient documentation

## 2023-04-17 DIAGNOSIS — H52223 Regular astigmatism, bilateral: Secondary | ICD-10-CM | POA: Diagnosis not present

## 2023-04-17 DIAGNOSIS — H01001 Unspecified blepharitis right upper eyelid: Secondary | ICD-10-CM | POA: Diagnosis not present

## 2023-04-17 SURGERY — CATARACT EXTRACTION PHACO AND INTRAOCULAR LENS PLACEMENT (IOC) with placement of Corticosteroid
Anesthesia: Monitor Anesthesia Care | Site: Eye | Laterality: Right

## 2023-04-17 MED ORDER — MOXIFLOXACIN HCL 5 MG/ML IO SOLN
INTRAOCULAR | Status: DC | PRN
  Administered 2023-04-17: .2 mL via INTRACAMERAL

## 2023-04-17 MED ORDER — BSS IO SOLN
INTRAOCULAR | Status: DC | PRN
  Administered 2023-04-17: 15 mL via INTRAOCULAR

## 2023-04-17 MED ORDER — TROPICAMIDE 1 % OP SOLN
1.0000 [drp] | OPHTHALMIC | Status: AC | PRN
  Administered 2023-04-17 (×3): 1 [drp] via OPHTHALMIC

## 2023-04-17 MED ORDER — ORAL CARE MOUTH RINSE: 15.0000 mL | Freq: Once | OROMUCOSAL | Status: DC

## 2023-04-17 MED ORDER — CHLORHEXIDINE GLUCONATE 0.12 % MT SOLN: 15.0000 mL | Freq: Once | OROMUCOSAL | Status: DC

## 2023-04-17 MED ORDER — TETRACAINE HCL 0.5 % OP SOLN
1.0000 [drp] | OPHTHALMIC | Status: AC | PRN
  Administered 2023-04-17 (×3): 1 [drp] via OPHTHALMIC

## 2023-04-17 MED ORDER — POVIDONE-IODINE 5 % OP SOLN
OPHTHALMIC | Status: DC | PRN
  Administered 2023-04-17: 1 via OPHTHALMIC

## 2023-04-17 MED ORDER — LIDOCAINE HCL (PF) 1 % IJ SOLN
INTRAOCULAR | Status: DC | PRN
  Administered 2023-04-17: 1 mL via OPHTHALMIC

## 2023-04-17 MED ORDER — SODIUM HYALURONATE 23MG/ML IO SOSY
PREFILLED_SYRINGE | INTRAOCULAR | Status: DC | PRN
  Administered 2023-04-17: .6 mL via INTRAOCULAR

## 2023-04-17 MED ORDER — STERILE WATER FOR IRRIGATION IR SOLN
Status: DC | PRN
  Administered 2023-04-17: 250 mL

## 2023-04-17 MED ORDER — LIDOCAINE HCL 3.5 % OP GEL
1.0000 | Freq: Once | OPHTHALMIC | Status: AC
  Administered 2023-04-17: 1 via OPHTHALMIC

## 2023-04-17 MED ORDER — SODIUM HYALURONATE 10 MG/ML IO SOLUTION
PREFILLED_SYRINGE | INTRAOCULAR | Status: DC | PRN
  Administered 2023-04-17: .85 mL via INTRAOCULAR

## 2023-04-17 MED ORDER — PHENYLEPHRINE HCL 2.5 % OP SOLN
1.0000 [drp] | OPHTHALMIC | Status: AC | PRN
  Administered 2023-04-17 (×3): 1 [drp] via OPHTHALMIC

## 2023-04-17 MED ORDER — EPINEPHRINE PF 1 MG/ML IJ SOLN
INTRAOCULAR | Status: DC | PRN
  Administered 2023-04-17: 500 mL

## 2023-04-17 SURGICAL SUPPLY — 15 items
CATARACT SUITE SIGHTPATH (MISCELLANEOUS) ×1
CLOTH BEACON ORANGE TIMEOUT ST (SAFETY) ×1 IMPLANT
EYE SHIELD UNIVERSAL CLEAR (GAUZE/BANDAGES/DRESSINGS) IMPLANT
FEE CATARACT SUITE SIGHTPATH (MISCELLANEOUS) ×1 IMPLANT
GLOVE BIOGEL PI IND STRL 7.0 (GLOVE) ×2 IMPLANT
LENS IOL TECNIS EYHANCE 19.0 (Intraocular Lens) IMPLANT
NDL HYPO 18GX1.5 BLUNT FILL (NEEDLE) ×1 IMPLANT
NEEDLE HYPO 18GX1.5 BLUNT FILL (NEEDLE) ×1
PAD ARMBOARD 7.5X6 YLW CONV (MISCELLANEOUS) ×1 IMPLANT
POSITIONER HEAD 8X9X4 ADT (SOFTGOODS) ×1 IMPLANT
RING MALYGIN (MISCELLANEOUS) IMPLANT
RING MALYGIN 7.0 (MISCELLANEOUS) IMPLANT
SYR TB 1ML LL NO SAFETY (SYRINGE) ×1 IMPLANT
TAPE SURG TRANSPORE 1 IN (GAUZE/BANDAGES/DRESSINGS) IMPLANT
WATER STERILE IRR 250ML POUR (IV SOLUTION) ×1 IMPLANT

## 2023-04-17 NOTE — Anesthesia Postprocedure Evaluation (Signed)
Anesthesia Post Note  Patient: MENZO BRAAM  Procedure(s) Performed: CATARACT EXTRACTION PHACO AND INTRAOCULAR LENS PLACEMENT (IOC) (Right: Eye)  Patient location during evaluation: Short Stay Anesthesia Type: MAC Level of consciousness: awake and alert Pain management: pain level controlled Vital Signs Assessment: post-procedure vital signs reviewed and stable Respiratory status: spontaneous breathing Cardiovascular status: blood pressure returned to baseline Postop Assessment: no apparent nausea or vomiting Anesthetic complications: no   No notable events documented.   Last Vitals:  Vitals:   04/17/23 0737  BP: (!) 167/96  Pulse: 77  Resp: 14  Temp: 36.8 C  SpO2: 100%    Last Pain:  Vitals:   04/17/23 0737  TempSrc: Oral  PainSc: 0-No pain                 Tiffanee Mcnee

## 2023-04-17 NOTE — Discharge Instructions (Signed)
Please discharge patient when stable, will follow up today with Dr. Carnella Fryman at the [facility] office immediately following discharge.  Leave shield in place until visit.  All paperwork with discharge instructions will be given at the office.  [facility] Address:  [address]  [place], [place] [address]  

## 2023-04-17 NOTE — Interval H&P Note (Signed)
History and Physical Interval Note:  04/17/2023 8:24 AM  Juan Ponce  has presented today for surgery, with the diagnosis of combined forms age related cataract, right eye.  The various methods of treatment have been discussed with the patient and family. After consideration of risks, benefits and other options for treatment, the patient has consented to  Procedure(s) with comments: CATARACT EXTRACTION PHACO AND INTRAOCULAR LENS PLACEMENT (IOC) with placement of Corticosteroid (Right) - CDE: as a surgical intervention.  The patient's history has been reviewed, patient examined, no change in status, stable for surgery.  I have reviewed the patient's chart and labs.  Questions were answered to the patient's satisfaction.     Fabio Pierce

## 2023-04-17 NOTE — Anesthesia Preprocedure Evaluation (Signed)
Anesthesia Evaluation  Patient identified by MRN, date of birth, ID band Patient awake    Reviewed: Allergy & Precautions, NPO status , Patient's Chart, lab work & pertinent test results  Airway Mallampati: I       Dental no notable dental hx. (+) Dental Advisory Given, Teeth Intact   Pulmonary neg pulmonary ROS   Pulmonary exam normal        Cardiovascular hypertension, Pt. on medications Normal cardiovascular exam Rhythm:Regular Rate:Normal     Neuro/Psych negative neurological ROS  negative psych ROS   GI/Hepatic negative GI ROS, Neg liver ROS,,,  Endo/Other  negative endocrine ROS    Renal/GU negative Renal ROS  Male genitourinary complaint: Prostate Cancer.  negative genitourinary   Musculoskeletal negative musculoskeletal ROS (+)    Abdominal Normal abdominal exam  (+)   Peds  Hematology negative hematology ROS (+)   Anesthesia Other Findings Prostate Cancer  Reproductive/Obstetrics                             Anesthesia Physical Anesthesia Plan  ASA: 2  Anesthesia Plan: MAC   Post-op Pain Management: Minimal or no pain anticipated   Induction:   PONV Risk Score and Plan:   Airway Management Planned: Nasal Cannula and Natural Airway  Additional Equipment: None  Intra-op Plan:   Post-operative Plan:   Informed Consent: I have reviewed the patients History and Physical, chart, labs and discussed the procedure including the risks, benefits and alternatives for the proposed anesthesia with the patient or authorized representative who has indicated his/her understanding and acceptance.     Dental advisory given  Plan Discussed with: CRNA  Anesthesia Plan Comments:         Anesthesia Quick Evaluation

## 2023-04-17 NOTE — Transfer of Care (Signed)
Immediate Anesthesia Transfer of Care Note  Patient: Juan Ponce  Procedure(s) Performed: CATARACT EXTRACTION PHACO AND INTRAOCULAR LENS PLACEMENT (IOC) (Right: Eye)  Patient Location: Short Stay  Anesthesia Type:MAC  Level of Consciousness: awake  Airway & Oxygen Therapy: Patient Spontanous Breathing  Post-op Assessment: Report given to RN  Post vital signs: Reviewed and stable  Last Vitals:  Vitals Value Taken Time  BP    Temp    Pulse    Resp    SpO2      Last Pain:  Vitals:   04/17/23 0737  TempSrc: Oral  PainSc: 0-No pain      Patients Stated Pain Goal: 6 (04/17/23 0737)  Complications: No notable events documented.

## 2023-04-17 NOTE — Op Note (Signed)
Date of procedure: 04/17/23  Pre-operative diagnosis:  Visually significant combined form age-related cataract, Right Eye (H25.811)  Post-operative diagnosis:  Visually significant combined form age-related cataract, Right Eye (H25.811)  Procedure: Removal of cataract via phacoemulsification and insertion of intra-ocular lens Laural Benes and Johnson DIB00 +19.0D into the capsular bag of the Right Eye  Attending surgeon: Rudy Jew. Kawanna Christley, MD, MA  Anesthesia: MAC, Topical Akten  Complications: None  Estimated Blood Loss: <63mL (minimal)  Specimens: None  Implants: As above  Indications:  Visually significant age-related cataract, Right Eye  Procedure:  The patient was seen and identified in the pre-operative area. The operative eye was identified and dilated.  The operative eye was marked.  Topical anesthesia was administered to the operative eye.     The patient was then to the operative suite and placed in the supine position.  A timeout was performed confirming the patient, procedure to be performed, and all other relevant information.   The patient's face was prepped and draped in the usual fashion for intra-ocular surgery.  A lid speculum was placed into the operative eye and the surgical microscope moved into place and focused.  A superotemporal paracentesis was created using a 20 gauge paracentesis blade.  Shugarcaine was injected into the anterior chamber.  Viscoelastic was injected into the anterior chamber.  A temporal clear-corneal main wound incision was created using a 2.68mm microkeratome.  A continuous curvilinear capsulorrhexis was initiated using an irrigating cystitome and completed using capsulorrhexis forceps.  Hydrodissection and hydrodeliniation were performed.  Viscoelastic was injected into the anterior chamber.  A phacoemulsification handpiece and a chopper as a second instrument were used to remove the nucleus and epinucleus. The irrigation/aspiration handpiece was used to  remove any remaining cortical material.   The capsular bag was reinflated with viscoelastic, checked, and found to be intact.  The intraocular lens was inserted into the capsular bag.  The irrigation/aspiration handpiece was used to remove any remaining viscoelastic.  The clear corneal wound and paracentesis wounds were then hydrated and checked with Weck-Cels to be watertight. 0.65mL of Moxfloxacin was injected into the anterior chamber. The lid-speculum was removed.  The drape was removed.  The patient's face was cleaned with a wet and dry 4x4. A clear shield was taped over the eye. The patient was taken to the post-operative care unit in good condition, having tolerated the procedure well.  Post-Op Instructions: The patient will follow up at [facility] for a same day post-operative evaluation and will receive all other orders and instructions.

## 2023-04-20 HISTORY — PX: EYE SURGERY: SHX253

## 2023-05-18 DIAGNOSIS — H25812 Combined forms of age-related cataract, left eye: Secondary | ICD-10-CM | POA: Diagnosis not present

## 2023-05-19 ENCOUNTER — Encounter (HOSPITAL_COMMUNITY)
Admission: RE | Admit: 2023-05-19 | Discharge: 2023-05-19 | Disposition: A | Discharge status: Home / Self Care | Payer: Medicare HMO | Source: Ambulatory Visit | Attending: Ophthalmology | Admitting: Ophthalmology

## 2023-05-19 ENCOUNTER — Encounter (HOSPITAL_COMMUNITY): Payer: Self-pay | Admitting: Ophthalmology

## 2023-05-20 NOTE — H&P (Signed)
Surgical History & Physical  Patient Name: Juan Ponce  DOB: [birth year]  Surgery: Cataract extraction with intraocular lens implant phacoemulsification; Left Eye Surgeon: Fabio Pierce MD Surgery Date: 05/22/2023 Pre-Op Date: 04/20/2023  HPI: A 32 Yr. old male patient 1. The patient is returning for a 3 DAY POST OP follow-up of the right eye and Pre-Op left os. Since the last visit, the affected area is stable. The patient's vision is improved. The complaint is associated with itching. Patient is following medication instructions. 2. The patient is also here for pre-op of the left eye. The complete is associated with having trouble seeing small print in the left eye. There is a very large difference between the two eyes. He is scheduled for sx on 12//20. This is negatively affecting the patient's quality of life and the patient is unable to function adequately in life with the current level of vision. HPI was performed by Fabio Pierce .  Medical History: Cataracts  Cancer High Blood Pressure acid reflux  Review of Systems Cardiovascular High Blood Pressure Gastrointestinal Acid reflux All recorded systems are negative except as noted above.  Social Never smoked   Medication Visine, Prednisolone-moxiflox-bromfen,  diltiazem HCl ,  pantoprazole ,  olmesartan ,  alfuzosin   Sx/Procedures Phaco c IOL OD,  Prostate CA surgery  Drug Allergies  NKDA  History & Physical: Heent: cataract NECK: supple without bruits LUNGS: lungs clear to auscultation CV: regular rate and rhythm Abdomen: soft and non-tender  Impression & Plan: Assessment: 1.  CATARACT EXTRACTION STATUS; Right Eye (Z98.41) 2.  COMBINED FORMS AGE RELATED CATARACT; Left Eye (H25.812) 3.  NUCLEAR SCLEROSIS AGE RELATED; Left Eye (H25.12)  Plan: 1.  3 days after cataract surgery. Doing well with improved vision and normal eye pressure. Call with any problems or concerns. Continue Pred-Moxi-Brom 3x/day for 4  more days and then 2x/day for 3 more weeks.  2.  Cataract accounts for the patient's decreased vision. This visual impairment is not correctable with a tolerable change in glasses or contact lenses. Cataract surgery with an implantation of a new lens should significantly improve the visual and functional status of the patient. Discussed all risks, benefits, alternatives, and potential complications. Discussed the procedures and recovery. Patient desires to have surgery. A-scan ordered and performed today for intra-ocular lens calculations. The surgery will be performed in order to improve vision for driving, reading, and for eye examinations. Recommend phacoemulsification with intra-ocular lens. Recommend Dextenza for post-operative pain and inflammation. Left Eye. Surgery required to correct imbalance of vision. Dilates well - shugarcaine by protocol.  3. See above

## 2023-05-22 ENCOUNTER — Ambulatory Visit (HOSPITAL_COMMUNITY): Payer: Medicare HMO | Admitting: Certified Registered"

## 2023-05-22 ENCOUNTER — Encounter (HOSPITAL_COMMUNITY)
Admission: RE | Disposition: A | Discharge status: Home / Self Care | Payer: Self-pay | Source: Home / Self Care | Attending: Ophthalmology

## 2023-05-22 ENCOUNTER — Ambulatory Visit (HOSPITAL_COMMUNITY)
Admission: RE | Admit: 2023-05-22 | Discharge: 2023-05-22 | Disposition: A | Discharge status: Home / Self Care | Payer: Medicare HMO | Attending: Ophthalmology | Admitting: Ophthalmology

## 2023-05-22 DIAGNOSIS — H269 Unspecified cataract: Secondary | ICD-10-CM | POA: Diagnosis not present

## 2023-05-22 DIAGNOSIS — H25812 Combined forms of age-related cataract, left eye: Secondary | ICD-10-CM | POA: Insufficient documentation

## 2023-05-22 DIAGNOSIS — I10 Essential (primary) hypertension: Secondary | ICD-10-CM | POA: Diagnosis not present

## 2023-05-22 DIAGNOSIS — Z9841 Cataract extraction status, right eye: Secondary | ICD-10-CM | POA: Insufficient documentation

## 2023-05-22 HISTORY — PX: CATARACT EXTRACTION W/PHACO: SHX586

## 2023-05-22 SURGERY — PHACOEMULSIFICATION, CATARACT, WITH IOL INSERTION
Anesthesia: Monitor Anesthesia Care | Site: Eye | Laterality: Left

## 2023-05-22 MED ORDER — TETRACAINE HCL 0.5 % OP SOLN
1.0000 [drp] | OPHTHALMIC | Status: AC | PRN
  Administered 2023-05-22 (×3): 1 [drp] via OPHTHALMIC

## 2023-05-22 MED ORDER — POVIDONE-IODINE 5 % OP SOLN
OPHTHALMIC | Status: DC | PRN
  Administered 2023-05-22: 1 via OPHTHALMIC

## 2023-05-22 MED ORDER — TROPICAMIDE 1 % OP SOLN
1.0000 [drp] | OPHTHALMIC | Status: AC | PRN
  Administered 2023-05-22 (×3): 1 [drp] via OPHTHALMIC

## 2023-05-22 MED ORDER — SODIUM HYALURONATE 23MG/ML IO SOSY
PREFILLED_SYRINGE | INTRAOCULAR | Status: DC | PRN
  Administered 2023-05-22: .6 mL via INTRAOCULAR

## 2023-05-22 MED ORDER — EPINEPHRINE PF 1 MG/ML IJ SOLN
INTRAOCULAR | Status: DC | PRN
  Administered 2023-05-22: 500 mL

## 2023-05-22 MED ORDER — STERILE WATER FOR IRRIGATION IR SOLN
Status: DC | PRN
  Administered 2023-05-22: 1

## 2023-05-22 MED ORDER — SODIUM HYALURONATE 10 MG/ML IO SOLUTION
PREFILLED_SYRINGE | INTRAOCULAR | Status: DC | PRN
  Administered 2023-05-22: .85 mL via INTRAOCULAR

## 2023-05-22 MED ORDER — LIDOCAINE HCL 3.5 % OP GEL
1.0000 | Freq: Once | OPHTHALMIC | Status: AC
  Administered 2023-05-22: 1 via OPHTHALMIC

## 2023-05-22 MED ORDER — MOXIFLOXACIN HCL 5 MG/ML IO SOLN
INTRAOCULAR | Status: DC | PRN
  Administered 2023-05-22: .3 mL via INTRACAMERAL

## 2023-05-22 MED ORDER — BSS IO SOLN
INTRAOCULAR | Status: DC | PRN
  Administered 2023-05-22: 15 mL via INTRAOCULAR

## 2023-05-22 MED ORDER — PHENYLEPHRINE HCL 2.5 % OP SOLN
1.0000 [drp] | OPHTHALMIC | Status: AC | PRN
Start: 2023-05-22 — End: 2023-05-22
  Administered 2023-05-22 (×3): 1 [drp] via OPHTHALMIC

## 2023-05-22 MED ORDER — LIDOCAINE HCL (PF) 1 % IJ SOLN
INTRAOCULAR | Status: DC | PRN
  Administered 2023-05-22: 1 mL via OPHTHALMIC

## 2023-05-22 SURGICAL SUPPLY — 14 items
CATARACT SUITE SIGHTPATH (MISCELLANEOUS) ×1
CLOTH BEACON ORANGE TIMEOUT ST (SAFETY) ×1 IMPLANT
EYE SHIELD UNIVERSAL CLEAR (GAUZE/BANDAGES/DRESSINGS) IMPLANT
FEE CATARACT SUITE SIGHTPATH (MISCELLANEOUS) ×1 IMPLANT
GLOVE BIOGEL PI IND STRL 7.0 (GLOVE) ×2 IMPLANT
LENS IOL TECNIS EYHANCE 18.5 (Intraocular Lens) IMPLANT
NDL HYPO 18GX1.5 BLUNT FILL (NEEDLE) ×1 IMPLANT
NEEDLE HYPO 18GX1.5 BLUNT FILL (NEEDLE) ×1
PAD ARMBOARD 7.5X6 YLW CONV (MISCELLANEOUS) ×1 IMPLANT
POSITIONER HEAD 8X9X4 ADT (SOFTGOODS) ×1 IMPLANT
SYR TB 1ML LL NO SAFETY (SYRINGE) ×1 IMPLANT
TAPE SURG TRANSPORE 1 IN (GAUZE/BANDAGES/DRESSINGS) IMPLANT
TIP IRRIGATON/ASPIRATION (MISCELLANEOUS) IMPLANT
WATER STERILE IRR 250ML POUR (IV SOLUTION) ×1 IMPLANT

## 2023-05-22 NOTE — Op Note (Signed)
Date of procedure: 05/22/23  Pre-operative diagnosis: Visually significant age-related combined cataract, Left Eye (H25.812)  Post-operative diagnosis: Visually significant age-related combined cataract, Left Eye (H25.812)  Procedure: Removal of cataract via phacoemulsification and insertion of intra-ocular lens Laural Benes and Johnson DIB00 +18.5D into the capsular bag of the Left Eye  Attending surgeon: Rudy Jew. Georgiana Spillane, MD, MA  Anesthesia: MAC, Topical Akten  Complications: None  Estimated Blood Loss: <41mL (minimal)  Specimens: None  Implants: As above  Indications:  Visually significant age-related cataract, Left Eye  Procedure:  The patient was seen and identified in the pre-operative area. The operative eye was identified and dilated.  The operative eye was marked.  Topical anesthesia was administered to the operative eye.     The patient was then to the operative suite and placed in the supine position.  A timeout was performed confirming the patient, procedure to be performed, and all other relevant information.   The patient's face was prepped and draped in the usual fashion for intra-ocular surgery.  A lid speculum was placed into the operative eye and the surgical microscope moved into place and focused.  An inferotemporal paracentesis was created using a 20 gauge paracentesis blade.  Shugarcaine was injected into the anterior chamber.  Viscoelastic was injected into the anterior chamber.  A temporal clear-corneal main wound incision was created using a 2.56mm microkeratome.  A continuous curvilinear capsulorrhexis was initiated using an irrigating cystitome and completed using capsulorrhexis forceps.  Hydrodissection and hydrodeliniation were performed.  Viscoelastic was injected into the anterior chamber.  A phacoemulsification handpiece and a chopper as a second instrument were used to remove the nucleus and epinucleus. The irrigation/aspiration handpiece was used to remove any  remaining cortical material.   The capsular bag was reinflated with viscoelastic, checked, and found to be intact.  The intraocular lens was inserted into the capsular bag.  The irrigation/aspiration handpiece was used to remove any remaining viscoelastic.  The clear corneal wound and paracentesis wounds were then hydrated and checked with Weck-Cels to be watertight. 0.45mL of Moxfloxacin was injected into the anterior chamber. The lid-speculum was removed.  The drape was removed.  The patient's face was cleaned with a wet and dry 4x4.    A clear shield was taped over the eye. The patient was taken to the post-operative care unit in good condition, having tolerated the procedure well.  Post-Op Instructions: The patient will follow up at [facility] for a same day post-operative evaluation and will receive all other orders and instructions.

## 2023-05-22 NOTE — Anesthesia Procedure Notes (Signed)
Procedure Name: MAC Date/Time: 05/22/2023 11:05 AM  Performed by: Julian Reil, CRNAPre-anesthesia Checklist: Patient identified, Emergency Drugs available, Suction available and Patient being monitored Patient Re-evaluated:Patient Re-evaluated prior to induction Oxygen Delivery Method: Nasal cannula Induction Type: IV induction Placement Confirmation: positive ETCO2

## 2023-05-22 NOTE — Discharge Instructions (Signed)
Please discharge patient when stable, will follow up today with Dr. Carnella Fryman at the [facility] office immediately following discharge.  Leave shield in place until visit.  All paperwork with discharge instructions will be given at the office.  [facility] Address:  [address]  [place], [place] [address]  

## 2023-05-22 NOTE — Anesthesia Preprocedure Evaluation (Signed)
 Anesthesia Evaluation  Patient identified by MRN, date of birth, ID band Patient awake    Reviewed: Allergy & Precautions, NPO status , Patient's Chart, lab work & pertinent test results  Airway Mallampati: I       Dental no notable dental hx. (+) Dental Advisory Given, Teeth Intact   Pulmonary neg pulmonary ROS   Pulmonary exam normal        Cardiovascular hypertension, Pt. on medications Normal cardiovascular exam Rhythm:Regular Rate:Normal     Neuro/Psych negative neurological ROS  negative psych ROS   GI/Hepatic negative GI ROS, Neg liver ROS,,,  Endo/Other  negative endocrine ROS    Renal/GU negative Renal ROS  Male genitourinary complaint: Prostate Cancer.  negative genitourinary   Musculoskeletal negative musculoskeletal ROS (+)    Abdominal Normal abdominal exam  (+)   Peds  Hematology negative hematology ROS (+)   Anesthesia Other Findings Prostate Cancer  Reproductive/Obstetrics                             Anesthesia Physical Anesthesia Plan  ASA: 2  Anesthesia Plan: MAC   Post-op Pain Management: Minimal or no pain anticipated   Induction:   PONV Risk Score and Plan:   Airway Management Planned: Nasal Cannula and Natural Airway  Additional Equipment: None  Intra-op Plan:   Post-operative Plan:   Informed Consent: I have reviewed the patients History and Physical, chart, labs and discussed the procedure including the risks, benefits and alternatives for the proposed anesthesia with the patient or authorized representative who has indicated his/her understanding and acceptance.     Dental advisory given  Plan Discussed with: CRNA  Anesthesia Plan Comments:         Anesthesia Quick Evaluation

## 2023-05-22 NOTE — Transfer of Care (Signed)
Immediate Anesthesia Transfer of Care Note  Patient: Juan Ponce  Procedure(s) Performed: CATARACT EXTRACTION PHACO AND INTRAOCULAR LENS PLACEMENT (IOC) (Left: Eye)  Patient Location: Short Stay  Anesthesia Type:MAC  Level of Consciousness: awake, alert , and oriented  Airway & Oxygen Therapy: Patient Spontanous Breathing  Post-op Assessment: Report given to RN and Post -op Vital signs reviewed and stable  Post vital signs: Reviewed and stable  Last Vitals:  Vitals Value Taken Time  BP    Temp    Pulse    Resp    SpO2      Last Pain:  Vitals:   05/22/23 1030  PainSc: 0-No pain         Complications: No notable events documented.

## 2023-05-22 NOTE — Interval H&P Note (Signed)
History and Physical Interval Note:  05/22/2023 11:02 AM  Juan Ponce  has presented today for surgery, with the diagnosis of combined forms age related cataract, left eye.  The various methods of treatment have been discussed with the patient and family. After consideration of risks, benefits and other options for treatment, the patient has consented to  Procedure(s): CATARACT EXTRACTION PHACO AND INTRAOCULAR LENS PLACEMENT (IOC) (Left) as a surgical intervention.  The patient's history has been reviewed, patient examined, no change in status, stable for surgery.  I have reviewed the patient's chart and labs.  Questions were answered to the patient's satisfaction.     Fabio Pierce

## 2023-05-25 ENCOUNTER — Encounter (HOSPITAL_COMMUNITY): Payer: Self-pay | Admitting: Ophthalmology

## 2023-05-26 NOTE — Anesthesia Postprocedure Evaluation (Signed)
Anesthesia Post Note  Patient: Juan Ponce  Procedure(s) Performed: CATARACT EXTRACTION PHACO AND INTRAOCULAR LENS PLACEMENT (IOC) (Left: Eye)  Patient location during evaluation: Phase II Anesthesia Type: MAC Level of consciousness: awake Pain management: pain level controlled Vital Signs Assessment: post-procedure vital signs reviewed and stable Respiratory status: spontaneous breathing and respiratory function stable Cardiovascular status: blood pressure returned to baseline and stable Postop Assessment: no headache and no apparent nausea or vomiting Anesthetic complications: no Comments: Late entry   No notable events documented.   Last Vitals:  Vitals:   05/22/23 1129 05/22/23 1132  BP: (!) 172/95 (!) 156/85  Pulse: 72   Resp: 16   Temp: 36.6 C   SpO2: 100%     Last Pain:  Vitals:   05/25/23 1219  TempSrc:   PainSc: 0-No pain                 Windell Norfolk

## 2023-06-12 DIAGNOSIS — Z1211 Encounter for screening for malignant neoplasm of colon: Secondary | ICD-10-CM | POA: Diagnosis not present

## 2023-06-12 DIAGNOSIS — Z1212 Encounter for screening for malignant neoplasm of rectum: Secondary | ICD-10-CM | POA: Diagnosis not present

## 2023-07-16 ENCOUNTER — Other Ambulatory Visit (HOSPITAL_COMMUNITY): Payer: Self-pay

## 2023-07-30 ENCOUNTER — Other Ambulatory Visit (HOSPITAL_COMMUNITY): Payer: Self-pay

## 2023-07-30 MED ORDER — DIFLUPREDNATE 0.05 % OP EMUL
1.0000 [drp] | Freq: Four times a day (QID) | OPHTHALMIC | 4 refills | Status: AC
  Filled 2023-07-30: qty 5, 25d supply, fill #0

## 2023-09-07 DIAGNOSIS — H59033 Cystoid macular edema following cataract surgery, bilateral: Secondary | ICD-10-CM | POA: Diagnosis not present

## 2023-09-08 ENCOUNTER — Other Ambulatory Visit (HOSPITAL_COMMUNITY): Payer: Self-pay

## 2023-09-15 DIAGNOSIS — H43813 Vitreous degeneration, bilateral: Secondary | ICD-10-CM | POA: Diagnosis not present

## 2023-09-15 DIAGNOSIS — H35353 Cystoid macular degeneration, bilateral: Secondary | ICD-10-CM | POA: Diagnosis not present

## 2023-09-15 DIAGNOSIS — H35033 Hypertensive retinopathy, bilateral: Secondary | ICD-10-CM | POA: Diagnosis not present

## 2023-09-15 DIAGNOSIS — Z961 Presence of intraocular lens: Secondary | ICD-10-CM | POA: Diagnosis not present

## 2023-09-28 ENCOUNTER — Other Ambulatory Visit: Payer: Medicare HMO

## 2023-09-28 DIAGNOSIS — C61 Malignant neoplasm of prostate: Secondary | ICD-10-CM | POA: Diagnosis not present

## 2023-09-29 LAB — PSA: Prostate Specific Ag, Serum: 0.1 ng/mL (ref 0.0–4.0)

## 2023-10-05 ENCOUNTER — Ambulatory Visit: Payer: Medicare HMO | Admitting: Urology

## 2023-10-06 ENCOUNTER — Other Ambulatory Visit (HOSPITAL_COMMUNITY): Payer: Self-pay

## 2023-10-07 ENCOUNTER — Other Ambulatory Visit (HOSPITAL_COMMUNITY): Payer: Self-pay

## 2023-10-07 ENCOUNTER — Ambulatory Visit: Payer: Medicare HMO | Admitting: Urology

## 2023-10-07 VITALS — BP 190/103 | HR 103

## 2023-10-07 DIAGNOSIS — N401 Enlarged prostate with lower urinary tract symptoms: Secondary | ICD-10-CM

## 2023-10-07 DIAGNOSIS — N138 Other obstructive and reflux uropathy: Secondary | ICD-10-CM | POA: Diagnosis not present

## 2023-10-07 DIAGNOSIS — R300 Dysuria: Secondary | ICD-10-CM

## 2023-10-07 DIAGNOSIS — C61 Malignant neoplasm of prostate: Secondary | ICD-10-CM | POA: Diagnosis not present

## 2023-10-07 DIAGNOSIS — N5201 Erectile dysfunction due to arterial insufficiency: Secondary | ICD-10-CM | POA: Diagnosis not present

## 2023-10-07 DIAGNOSIS — R351 Nocturia: Secondary | ICD-10-CM | POA: Diagnosis not present

## 2023-10-07 LAB — URINALYSIS, ROUTINE W REFLEX MICROSCOPIC
Bilirubin, UA: NEGATIVE
Glucose, UA: NEGATIVE
Ketones, UA: NEGATIVE
Leukocytes,UA: NEGATIVE
Nitrite, UA: NEGATIVE
Protein,UA: NEGATIVE
RBC, UA: NEGATIVE
Specific Gravity, UA: 1.01 (ref 1.005–1.030)
Urobilinogen, Ur: 0.2 mg/dL (ref 0.2–1.0)
pH, UA: 7 (ref 5.0–7.5)

## 2023-10-07 MED ORDER — ALFUZOSIN HCL ER 10 MG PO TB24
10.0000 mg | ORAL_TABLET | Freq: Every day | ORAL | 3 refills | Status: AC
  Filled 2023-10-07: qty 90, 90d supply, fill #0

## 2023-10-07 MED ORDER — TADALAFIL 20 MG PO TABS
20.0000 mg | ORAL_TABLET | Freq: Every day | ORAL | 11 refills | Status: AC
  Filled 2023-10-07: qty 18, 18d supply, fill #0

## 2023-10-07 NOTE — Progress Notes (Signed)
 10/07/2023 10:30 AM   Juan Ponce 1950/10/02 [id number]  Referring provider: Minus Amel, MD [address] [place],  [place] [address]  Followup prostate cancer   HPI: Juan Ponce is a 72yo here for followup for prostate cancer and BPH. PSA 0.1. IPSS 3 QOl 1. Urine stream strong no straining to urinate. Nocturia 0-2x on uroxatral  10mg  at bedtime. He uses tadalafil  prn with good results   PMH: Past Medical History:  Diagnosis Date   Hypertension    Prostate cancer [facility])     Surgical History: Past Surgical History:  Procedure Laterality Date   CATARACT EXTRACTION W/PHACO Left 05/22/2023   Procedure: CATARACT EXTRACTION PHACO AND INTRAOCULAR LENS PLACEMENT (IOC);  Surgeon: Tarri Farm, MD;  Location: [facility];  Service: Ophthalmology;  Laterality: Left;  CDE 2.82   CYSTOSCOPY  12/23/2018   Procedure: CYSTOSCOPY FLEXIBLE;  Surgeon: Marco Severs, MD;  Location: [facility];  Service: Urology;;  no seeds found in bladder   EYE SURGERY Right 04/20/2023   KPE   NO PAST SURGERIES     PROSTATE BIOPSY     RADIOACTIVE SEED IMPLANT N/A 12/23/2018   Procedure: RADIOACTIVE SEED IMPLANT/BRACHYTHERAPY IMPLANT;  Surgeon: Marco Severs, MD;  Location: [facility];  Service: Urology;  Laterality: N/A;    56   seeds implanted   SPACE OAR INSTILLATION N/A 12/23/2018   Procedure: SPACE OAR INSTILLATION;  Surgeon: Marco Severs, MD;  Location: [facility];  Service: Urology;  Laterality: N/A;    Home Medications:  Allergies as of 10/07/2023   No Known Allergies      Medication List        Accurate as of Oct 07, 2023 10:30 AM. If you have any questions, ask your nurse or doctor.          alfuzosin  10 MG 24 hr tablet Commonly known as: UROXATRAL  Take 1 tablet (10 mg total) by mouth daily with breakfast.   alfuzosin  10 MG 24 hr tablet Commonly known as: UROXATRAL  Take 1 tablet (10 mg total) by mouth  daily.   Difluprednate  0.05 % Emul Place 1 drop into both eyes 4 (four) times daily.   Dilt-XR 240 MG 24 hr capsule Generic drug: diltiazem    Dilt-XR 240 MG 24 hr capsule Generic drug: diltiazem  TAKE 1 CAPSULE BY MOUTH ONCE DAILY AT NIGHT   Dilt-XR 240 MG 24 hr capsule Generic drug: diltiazem  Take 1 capsule (240 mg total) by mouth at night.   diltiazem  240 MG 24 hr capsule Commonly known as: Dilt-XR Take 1 capsule (240 mg total) by mouth at night.   olmesartan  40 MG tablet Commonly known as: BENICAR    olmesartan  40 MG tablet Commonly known as: BENICAR  TAKE 1 TABLET BY MOUTH ONCE DAILY IN MORNING   olmesartan  40 MG tablet Commonly known as: BENICAR  Take 1 tablet (40 mg total) by mouth in the morning.   olmesartan  40 MG tablet Commonly known as: BENICAR  Take 1 tablet (40 mg total) by mouth daily.   pantoprazole  40 MG tablet Commonly known as: PROTONIX  Take 1 tablet (40 mg total) by mouth daily.   pantoprazole  40 MG tablet Commonly known as: PROTONIX  Take 1 tablet (40 mg total) by mouth daily.   sildenafil  20 MG tablet Commonly known as: REVATIO  Take 1 tablet (20 mg total) by mouth daily.   tadalafil  20 MG tablet Commonly known as: CIALIS  Take 1 tablet (20 mg total) by mouth daily as needed for erectile  dysfunction   Telmisartan-amLODIPine 40-10 MG Tabs   traMADol  50 MG tablet Commonly known as: ULTRAM  Take by mouth every 6 (six) hours as needed.        Allergies: No Known Allergies  Family History: Family History  Problem Relation Age of Onset   Prostate cancer Brother    Breast cancer Sister    Lung cancer Brother    Colon cancer Neg Hx    Pancreatic cancer Neg Hx     Social History:  reports that he has never smoked. He has never used smokeless tobacco. He reports current alcohol use. He reports that he does not use drugs.  ROS: All other review of systems were reviewed and are negative except what is noted above in HPI  Physical Exam: BP  (!) 190/103   Pulse (!) 103   Constitutional:  Alert and oriented, No acute distress. HEENT: [place] AT, moist mucus membranes.  Trachea midline, no masses. Cardiovascular: No clubbing, cyanosis, or edema. Respiratory: Normal respiratory effort, no increased work of breathing. GI: Abdomen is soft, nontender, nondistended, no abdominal masses GU: No CVA tenderness.  Lymph: No cervical or inguinal lymphadenopathy. Skin: No rashes, bruises or suspicious lesions. Neurologic: Grossly intact, no focal deficits, moving all 4 extremities. Psychiatric: Normal mood and affect.  Laboratory Data: Lab Results  Component Value Date   WBC 7.0 12/20/2018   HGB 13.7 12/20/2018   HCT 41.2 12/20/2018   MCV 97.4 12/20/2018   PLT 246 12/20/2018    Lab Results  Component Value Date   CREATININE 0.87 12/20/2018    Lab Results  Component Value Date   PSA 0.6 09/26/2019    No results found for: "TESTOSTERONE "  No results found for: "HGBA1C"  Urinalysis    Component Value Date/Time   APPEARANCEUR Clear 10/07/2022 1442   GLUCOSEU Negative 10/07/2022 1442   BILIRUBINUR Negative 10/07/2022 1442   PROTEINUR Negative 10/07/2022 1442   NITRITE Negative 10/07/2022 1442   LEUKOCYTESUR Negative 10/07/2022 1442    Lab Results  Component Value Date   LABMICR Comment 10/07/2022   WBCUA None seen 09/25/2021   LABEPIT None seen 09/25/2021   BACTERIA None seen 09/25/2021    Pertinent Imaging:  No results found for this or any previous visit.  No results found for this or any previous visit.  No results found for this or any previous visit.  No results found for this or any previous visit.  No results found for this or any previous visit.  No results found for this or any previous visit.  No results found for this or any previous visit.  No results found for this or any previous visit.   Assessment & Plan:    1. Prostate cancer (HCC) (Primary) Followup 1year with a PSA - Urinalysis,  Routine w reflex microscopic  2. Benign prostatic hyperplasia with urinary obstruction Continue uroxatral  10mg  qhs  3. Nocturia Continue uroxatral  10mg  qhs  4. Erectile dysfunction due to arterial insufficiency Continue tadalafil  prn   No follow-ups on file.  Juan Nailer, MD  [facility] [place]

## 2023-10-13 ENCOUNTER — Encounter: Payer: Self-pay | Admitting: Urology

## 2023-10-13 NOTE — Patient Instructions (Signed)

## 2023-10-27 DIAGNOSIS — H35033 Hypertensive retinopathy, bilateral: Secondary | ICD-10-CM | POA: Diagnosis not present

## 2023-10-27 DIAGNOSIS — Z961 Presence of intraocular lens: Secondary | ICD-10-CM | POA: Diagnosis not present

## 2023-10-27 DIAGNOSIS — H35353 Cystoid macular degeneration, bilateral: Secondary | ICD-10-CM | POA: Diagnosis not present

## 2023-10-27 DIAGNOSIS — H44113 Panuveitis, bilateral: Secondary | ICD-10-CM | POA: Diagnosis not present

## 2023-10-27 DIAGNOSIS — H43813 Vitreous degeneration, bilateral: Secondary | ICD-10-CM | POA: Diagnosis not present

## 2023-12-08 DIAGNOSIS — H43813 Vitreous degeneration, bilateral: Secondary | ICD-10-CM | POA: Diagnosis not present

## 2023-12-08 DIAGNOSIS — H35033 Hypertensive retinopathy, bilateral: Secondary | ICD-10-CM | POA: Diagnosis not present

## 2023-12-08 DIAGNOSIS — Z961 Presence of intraocular lens: Secondary | ICD-10-CM | POA: Diagnosis not present

## 2023-12-08 DIAGNOSIS — H35353 Cystoid macular degeneration, bilateral: Secondary | ICD-10-CM | POA: Diagnosis not present

## 2024-01-18 ENCOUNTER — Other Ambulatory Visit: Payer: Self-pay

## 2024-01-18 ENCOUNTER — Other Ambulatory Visit (HOSPITAL_COMMUNITY): Payer: Self-pay

## 2024-01-21 ENCOUNTER — Other Ambulatory Visit (HOSPITAL_COMMUNITY): Payer: Self-pay

## 2024-01-26 DIAGNOSIS — H35353 Cystoid macular degeneration, bilateral: Secondary | ICD-10-CM | POA: Diagnosis not present

## 2024-01-26 DIAGNOSIS — Z961 Presence of intraocular lens: Secondary | ICD-10-CM | POA: Diagnosis not present

## 2024-01-26 DIAGNOSIS — H35033 Hypertensive retinopathy, bilateral: Secondary | ICD-10-CM | POA: Diagnosis not present

## 2024-01-26 DIAGNOSIS — H43813 Vitreous degeneration, bilateral: Secondary | ICD-10-CM | POA: Diagnosis not present

## 2024-02-10 ENCOUNTER — Other Ambulatory Visit (HOSPITAL_COMMUNITY): Payer: Self-pay

## 2024-02-10 DIAGNOSIS — E7849 Other hyperlipidemia: Secondary | ICD-10-CM | POA: Diagnosis not present

## 2024-02-10 DIAGNOSIS — E782 Mixed hyperlipidemia: Secondary | ICD-10-CM | POA: Diagnosis not present

## 2024-02-10 DIAGNOSIS — Z6830 Body mass index (BMI) 30.0-30.9, adult: Secondary | ICD-10-CM | POA: Diagnosis not present

## 2024-02-10 DIAGNOSIS — Z0001 Encounter for general adult medical examination with abnormal findings: Secondary | ICD-10-CM | POA: Diagnosis not present

## 2024-02-10 DIAGNOSIS — R7309 Other abnormal glucose: Secondary | ICD-10-CM | POA: Diagnosis not present

## 2024-02-10 DIAGNOSIS — R945 Abnormal results of liver function studies: Secondary | ICD-10-CM | POA: Diagnosis not present

## 2024-02-10 DIAGNOSIS — I10 Essential (primary) hypertension: Secondary | ICD-10-CM | POA: Diagnosis not present

## 2024-02-10 DIAGNOSIS — E6609 Other obesity due to excess calories: Secondary | ICD-10-CM | POA: Diagnosis not present

## 2024-02-10 DIAGNOSIS — C61 Malignant neoplasm of prostate: Secondary | ICD-10-CM | POA: Diagnosis not present

## 2024-02-10 DIAGNOSIS — Z0000 Encounter for general adult medical examination without abnormal findings: Secondary | ICD-10-CM | POA: Diagnosis not present

## 2024-02-10 DIAGNOSIS — Z1331 Encounter for screening for depression: Secondary | ICD-10-CM | POA: Diagnosis not present

## 2024-02-10 MED ORDER — PANTOPRAZOLE SODIUM 40 MG PO TBEC
40.0000 mg | DELAYED_RELEASE_TABLET | Freq: Every day | ORAL | 2 refills | Status: AC
  Filled 2024-05-05: qty 90, 90d supply, fill #0

## 2024-02-10 MED ORDER — OLMESARTAN MEDOXOMIL 40 MG PO TABS
40.0000 mg | ORAL_TABLET | Freq: Every day | ORAL | 2 refills | Status: AC
  Filled 2024-05-05: qty 90, 90d supply, fill #0

## 2024-02-10 MED ORDER — DILTIAZEM HCL ER 240 MG PO CP24
240.0000 mg | ORAL_CAPSULE | Freq: Every evening | ORAL | 2 refills | Status: AC
  Filled 2024-05-05: qty 90, 90d supply, fill #0

## 2024-02-10 MED ORDER — ALFUZOSIN HCL ER 10 MG PO TB24: 10.0000 mg | ORAL_TABLET | Freq: Every day | ORAL | 2 refills | Status: AC

## 2024-02-29 DIAGNOSIS — H40013 Open angle with borderline findings, low risk, bilateral: Secondary | ICD-10-CM | POA: Diagnosis not present

## 2024-02-29 DIAGNOSIS — H524 Presbyopia: Secondary | ICD-10-CM | POA: Diagnosis not present

## 2024-02-29 DIAGNOSIS — H59033 Cystoid macular edema following cataract surgery, bilateral: Secondary | ICD-10-CM | POA: Diagnosis not present

## 2024-05-05 ENCOUNTER — Other Ambulatory Visit (HOSPITAL_COMMUNITY): Payer: Self-pay

## 2024-05-05 MED ORDER — ALFUZOSIN HCL ER 10 MG PO TB24
10.0000 mg | ORAL_TABLET | Freq: Every day | ORAL | 3 refills | Status: AC
  Filled 2024-05-05: qty 90, 90d supply, fill #0

## 2024-05-06 ENCOUNTER — Other Ambulatory Visit (HOSPITAL_COMMUNITY): Payer: Self-pay

## 2024-09-30 ENCOUNTER — Other Ambulatory Visit

## 2024-10-07 ENCOUNTER — Ambulatory Visit: Admitting: Urology
